# Patient Record
Sex: Male | Born: 1985 | Race: White | Hispanic: No | Smoking: Current every day smoker
Health system: Southern US, Community
[De-identification: ages and names within clinical notes are randomized; demographics above are authoritative.]

## PROBLEM LIST (undated history)

## (undated) DIAGNOSIS — R569 Unspecified convulsions: Secondary | ICD-10-CM

## (undated) DIAGNOSIS — F988 Other specified behavioral and emotional disorders with onset usually occurring in childhood and adolescence: Secondary | ICD-10-CM

## (undated) HISTORY — PX: TONSILLECTOMY: SUR1361

---

## 2014-11-22 DIAGNOSIS — F1910 Other psychoactive substance abuse, uncomplicated: Secondary | ICD-10-CM | POA: Insufficient documentation

## 2014-11-22 DIAGNOSIS — J9602 Acute respiratory failure with hypercapnia: Secondary | ICD-10-CM | POA: Insufficient documentation

## 2014-11-22 DIAGNOSIS — R569 Unspecified convulsions: Secondary | ICD-10-CM | POA: Insufficient documentation

## 2015-03-18 ENCOUNTER — Ambulatory Visit
Admission: RE | Admit: 2015-03-18 | Discharge: 2015-03-18 | Disposition: A | Discharge status: Home / Self Care | Payer: No Typology Code available for payment source | Source: Ambulatory Visit | Attending: *Deleted | Admitting: *Deleted

## 2015-03-18 ENCOUNTER — Other Ambulatory Visit: Payer: Self-pay | Admitting: *Deleted

## 2015-03-18 DIAGNOSIS — R7611 Nonspecific reaction to tuberculin skin test without active tuberculosis: Secondary | ICD-10-CM

## 2015-04-04 ENCOUNTER — Ambulatory Visit
Admission: RE | Admit: 2015-04-04 | Discharge: 2015-04-04 | Disposition: A | Discharge status: Home / Self Care | Payer: No Typology Code available for payment source | Source: Ambulatory Visit | Attending: *Deleted | Admitting: *Deleted

## 2015-04-04 ENCOUNTER — Other Ambulatory Visit: Payer: Self-pay | Admitting: *Deleted

## 2015-04-04 DIAGNOSIS — R9389 Abnormal findings on diagnostic imaging of other specified body structures: Secondary | ICD-10-CM

## 2015-04-07 ENCOUNTER — Other Ambulatory Visit: Payer: Self-pay | Admitting: General Practice

## 2015-04-07 DIAGNOSIS — R9389 Abnormal findings on diagnostic imaging of other specified body structures: Secondary | ICD-10-CM

## 2015-04-21 ENCOUNTER — Other Ambulatory Visit: Payer: No Typology Code available for payment source

## 2018-02-28 ENCOUNTER — Emergency Department (HOSPITAL_COMMUNITY)
Admission: EM | Admit: 2018-02-28 | Discharge: 2018-03-01 | Disposition: A | Discharge status: Home / Self Care | Payer: No Typology Code available for payment source | Attending: Emergency Medicine | Admitting: Emergency Medicine

## 2018-02-28 ENCOUNTER — Encounter (HOSPITAL_COMMUNITY): Payer: Self-pay

## 2018-02-28 DIAGNOSIS — F17210 Nicotine dependence, cigarettes, uncomplicated: Secondary | ICD-10-CM | POA: Insufficient documentation

## 2018-02-28 DIAGNOSIS — G5630 Lesion of radial nerve, unspecified upper limb: Secondary | ICD-10-CM

## 2018-02-28 DIAGNOSIS — G5632 Lesion of radial nerve, left upper limb: Secondary | ICD-10-CM | POA: Insufficient documentation

## 2018-02-28 HISTORY — DX: Other specified behavioral and emotional disorders with onset usually occurring in childhood and adolescence: F98.8

## 2018-02-28 LAB — CBC WITH DIFFERENTIAL/PLATELET
Band Neutrophils: 0 %
Basophils Absolute: 0 10*3/uL (ref 0.0–0.1)
Basophils Relative: 0 %
Blasts: 0 %
Eosinophils Absolute: 0 10*3/uL (ref 0.0–0.7)
Eosinophils Relative: 0 %
HEMATOCRIT: 43.6 % (ref 39.0–52.0)
HEMOGLOBIN: 14.1 g/dL (ref 13.0–17.0)
LYMPHS PCT: 7 %
Lymphs Abs: 1 10*3/uL (ref 0.7–4.0)
MCH: 31.6 pg (ref 26.0–34.0)
MCHC: 32.3 g/dL (ref 30.0–36.0)
MCV: 97.8 fL (ref 78.0–100.0)
MONO ABS: 0.6 10*3/uL (ref 0.1–1.0)
Metamyelocytes Relative: 0 %
Monocytes Relative: 4 %
Myelocytes: 0 %
NRBC: 0 /100{WBCs}
Neutro Abs: 12.7 10*3/uL — ABNORMAL HIGH (ref 1.7–7.7)
Neutrophils Relative %: 89 %
OTHER: 0 %
PROMYELOCYTES ABS: 0 %
Platelets: 289 10*3/uL (ref 150–400)
RBC: 4.46 MIL/uL (ref 4.22–5.81)
RDW: 13.4 % (ref 11.5–15.5)
WBC: 14.3 10*3/uL — ABNORMAL HIGH (ref 4.0–10.5)

## 2018-02-28 LAB — BASIC METABOLIC PANEL
Anion gap: 12 (ref 5–15)
BUN: 12 mg/dL (ref 6–20)
CO2: 28 mmol/L (ref 22–32)
Calcium: 9.2 mg/dL (ref 8.9–10.3)
Chloride: 102 mmol/L (ref 101–111)
Creatinine, Ser: 0.69 mg/dL (ref 0.61–1.24)
GFR calc Af Amer: >60 mL/min (ref >60)
GFR calc non Af Amer: >60 mL/min (ref >60)
Glucose, Bld: 86 mg/dL (ref 65–99)
POTASSIUM: 4.2 mmol/L (ref 3.5–5.1)
SODIUM: 142 mmol/L (ref 135–145)

## 2018-02-28 NOTE — ED Triage Notes (Signed)
Pt presents with numbness to L arm that he first noted upon awakening this morning.  Pt reports when he went to bed, he was fine, denies any numbness to face or leg.  Pt reports playing drums last night but denies any injury.

## 2018-02-28 NOTE — ED Notes (Signed)
Pt brought into room, pt started to vomit when getting into bed.

## 2018-02-28 NOTE — ED Provider Notes (Signed)
[facility] [place] Provider [facility]   CSN: [id number] Arrival date & time: 02/28/18  2125     History   Chief Complaint No chief complaint on file.   HPI Juan Larocheaul Majerus Jr. is a 32 y.o. male.  Patient presents to the ED with a chief complaint of left arm weakness.  He states that he awoke with the symptoms this morning.  He states that he may have slept on it wrong last night.  He has not taken anything for the symptoms.  He states that he is unable to extend his arm, wrist, and fingers.  He also complains of sensation deficit.  He denies any similar symptoms anywhere else.    The history is provided by the patient. No language interpreter was used.    Past Medical History:  Diagnosis Date  . ADD (attention deficit disorder)     There are no active problems to display for this patient.   Past Surgical History:  Procedure Laterality Date  . TONSILLECTOMY          Home Medications    Prior to Admission medications   Not on File    Family History History reviewed. No pertinent family history.  Social History Social History   Tobacco Use  . Smoking status: Current Every Day Smoker    Packs/day: 0.50  . Smokeless tobacco: Never Used  Substance Use Topics  . Alcohol use: Not on file  . Drug use: Not on file     Allergies   Patient has no known allergies.   Review of Systems Review of Systems  All other systems reviewed and are negative.    Physical Exam Updated Vital Signs BP 125/84   Pulse 82   Temp 98.1 F (36.7 C) (Oral)   Resp (!) 21   Ht 5' 10.5" (1.791 m)   Wt 61.2 kg (135 lb)   SpO2 99%   BMI 19.10 kg/m   Physical Exam  Constitutional: He is oriented to person, place, and time. No distress.  HENT:  Head: Normocephalic and atraumatic.  Eyes: Pupils are equal, round, and reactive to light. Conjunctivae and EOM are normal.  Neck: No tracheal deviation present.  Cardiovascular: Normal rate and intact  distal pulses.  Intact distal pulses  Pulmonary/Chest: Effort normal. No respiratory distress.  Abdominal: Soft.  Musculoskeletal: Normal range of motion.  Neurological: He is alert and oriented to person, place, and time.  Left wrist drop, extensor strength is 0/5, unable to make thumbs up or extend fingers, decreased sensation over first webspace  Skin: Skin is warm and dry. He is not diaphoretic.  Psychiatric: Judgment normal.  Nursing note and vitals reviewed.    ED Treatments / Results  Labs (all labs ordered are listed, but only abnormal results are displayed) Labs Reviewed  CBC WITH DIFFERENTIAL/PLATELET - Abnormal; Notable for the following components:      Result Value   WBC 14.3 (*)    Neutro Abs 12.7 (*)    All other components within normal limits  BASIC METABOLIC PANEL    EKG None  Radiology No results found.  Procedures Procedures (including critical care time)  Medications Ordered in ED Medications - No data to display   Initial Impression / Assessment and Plan / ED Course  I have reviewed the triage vital signs and the nursing notes.  Pertinent labs & imaging results that were available during my care of the patient were reviewed by me and considered in my  medical decision making (see chart for details).     Patient with left wrist drop.  Concern for radial nerve palsy.  Patient is a heavy drinker per his uncle.  Patient states that he fell asleep in a chair last night.  He is unable to extend his fingers and thumb, unable to extend at the wrist, and has decreased sensation over the first webspace.  I briefly discussed the case with Dr. Wilford Corner from neurology, who recommends outpatient follow-up in 4-6 weeks.  Patient given cockup splint.  Final Clinical Impressions(s) / ED Diagnoses   Final diagnoses:  Radial nerve palsy    ED Discharge Orders        Ordered    Ambulatory referral to Neurology    Comments:  An appointment is requested in  approximately: 4-6 weeks   03/01/18 0042       Roxy Horseman, PA-C 03/01/18 0449    Dione Booze, MD 03/01/18 [phone]

## 2018-03-01 MED ORDER — ONDANSETRON 4 MG PO TBDP
8.0000 mg | ORAL_TABLET | Freq: Once | ORAL | Status: AC
  Administered 2018-03-01: 8 mg via ORAL
  Filled 2018-03-01: qty 2

## 2018-03-01 MED ORDER — ONDANSETRON HCL 4 MG PO TABS: 8.0000 mg | ORAL_TABLET | Freq: Once | ORAL | Status: DC

## 2018-03-01 MED ORDER — ONDANSETRON HCL 4 MG/2ML IJ SOLN
4.0000 mg | Freq: Once | INTRAMUSCULAR | Status: DC
  Filled 2018-03-01: qty 2

## 2018-03-01 NOTE — Progress Notes (Signed)
Orthopedic Tech Progress Note Patient Details:  Artelia Larocheaul Trier Jr. 1986-11-20 [id number]  Ortho Devices Type of Ortho Device: Velcro wrist splint Ortho Device/Splint Location: lue Ortho Device/Splint Interventions: Ordered, Application, Adjustment   Post Interventions Patient Tolerated: Well Instructions Provided: Care of device, Adjustment of device   Trinna PostMartinez, Chellsea Beckers J 03/01/2018, 12:26 AM

## 2018-03-01 NOTE — ED Notes (Signed)
PT states understanding of care given, follow up care. PT ambulated from ED to car with a steady gait.  

## 2018-07-17 ENCOUNTER — Emergency Department (HOSPITAL_COMMUNITY)
Admission: EM | Admit: 2018-07-17 | Discharge: 2018-07-17 | Disposition: A | Discharge status: Home / Self Care | Payer: Self-pay | Attending: Emergency Medicine | Admitting: Emergency Medicine

## 2018-07-17 ENCOUNTER — Emergency Department (HOSPITAL_COMMUNITY): Payer: Self-pay

## 2018-07-17 ENCOUNTER — Encounter (HOSPITAL_COMMUNITY): Payer: Self-pay

## 2018-07-17 ENCOUNTER — Other Ambulatory Visit: Payer: Self-pay

## 2018-07-17 DIAGNOSIS — F17200 Nicotine dependence, unspecified, uncomplicated: Secondary | ICD-10-CM | POA: Insufficient documentation

## 2018-07-17 DIAGNOSIS — R569 Unspecified convulsions: Secondary | ICD-10-CM | POA: Insufficient documentation

## 2018-07-17 DIAGNOSIS — F909 Attention-deficit hyperactivity disorder, unspecified type: Secondary | ICD-10-CM | POA: Insufficient documentation

## 2018-07-17 LAB — COMPREHENSIVE METABOLIC PANEL
ALBUMIN: 4.5 g/dL (ref 3.5–5.0)
ALK PHOS: 72 U/L (ref 38–126)
ALT: 16 U/L (ref 0–44)
ANION GAP: 13 (ref 5–15)
AST: 29 U/L (ref 15–41)
BUN: 16 mg/dL (ref 6–20)
CALCIUM: 9.5 mg/dL (ref 8.9–10.3)
CHLORIDE: 101 mmol/L (ref 98–111)
CO2: 27 mmol/L (ref 22–32)
CREATININE: 0.76 mg/dL (ref 0.61–1.24)
GFR calc non Af Amer: >60 mL/min (ref >60)
GLUCOSE: 109 mg/dL — AB (ref 70–99)
Potassium: 4.1 mmol/L (ref 3.5–5.1)
Sodium: 141 mmol/L (ref 135–145)
Total Bilirubin: 0.5 mg/dL (ref 0.3–1.2)
Total Protein: 7.3 g/dL (ref 6.5–8.1)

## 2018-07-17 LAB — CBG MONITORING, ED: GLUCOSE-CAPILLARY: 97 mg/dL (ref 70–99)

## 2018-07-17 NOTE — ED Provider Notes (Signed)
[facility]-EMERGENCY DEPT Provider Note   CSN: [id number] Arrival date & time: 07/17/18  1931     History   Chief Complaint Chief Complaint  Patient presents with  . Seizures    HPI Juan Larocheaul Fiorenza Jr. is a 32 y.o. male.  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  Describes having convulsions with postictal.  Patient states that he remembers being dizzy and then waking up.  Denies any bowel or bladder dysfunction.  No tongue injury.  Denies any headache or neck pain.  No recent illicit drug use or illness.  Feels back to his baseline.     Past Medical History:  Diagnosis Date  . ADD (attention deficit disorder)     There are no active problems to display for this patient.   Past Surgical History:  Procedure Laterality Date  . TONSILLECTOMY          Home Medications    Prior to Admission medications   Not on File    Family History No family history on file.  Social History Social History   Tobacco Use  . Smoking status: Current Every Day Smoker    Packs/day: 0.50  . Smokeless tobacco: Never Used  Substance Use Topics  . Alcohol use: Not on file  . Drug use: Not on file     Allergies   Patient has no known allergies.   Review of Systems Review of Systems  All other systems reviewed and are negative.    Physical Exam Updated Vital Signs BP 130/87 (BP Location: Right Arm)   Pulse 83   Temp 98.3 F (36.8 C) (Oral)   Resp 20   Ht 1.791 m (5' 10.5")   Wt 65.8 kg   SpO2 100%   BMI 20.51 kg/m   Physical Exam  Constitutional: He is oriented to person, place, and time. He appears well-developed and well-nourished.  Non-toxic appearance. No distress.  HENT:  Head: Normocephalic and atraumatic.  Eyes: Pupils are equal, round, and reactive to light. Conjunctivae, EOM and lids are normal.  Neck: Normal range  of motion. Neck supple. No tracheal deviation present. No thyroid mass present.  Cardiovascular: Normal rate, regular rhythm and normal heart sounds. Exam reveals no gallop.  No murmur heard. Pulmonary/Chest: Effort normal and breath sounds normal. No stridor. No respiratory distress. He has no decreased breath sounds. He has no wheezes. He has no rhonchi. He has no rales.  Abdominal: Soft. Normal appearance and bowel sounds are normal. He exhibits no distension. There is no tenderness. There is no rebound and no CVA tenderness.  Musculoskeletal: Normal range of motion. He exhibits no edema or tenderness.  Neurological: He is alert and oriented to person, place, and time. He has normal strength. No cranial nerve deficit or sensory deficit. GCS eye subscore is 4. GCS verbal subscore is 5. GCS motor subscore is 6.  Skin: Skin is warm and dry. No abrasion and no rash noted.  Psychiatric: He has a normal mood and affect. His speech is normal and behavior is normal.  Nursing note and vitals reviewed.    ED Treatments / Results  Labs (all labs ordered are listed, but only abnormal results are displayed) Labs Reviewed  CBG MONITORING, ED  I-STAT CHEM 8, ED    EKG None  Radiology No results found.  Procedures Procedures (including critical care time)  Medications Ordered in ED  Medications - No data to display   Initial Impression / Assessment and Plan / ED Course  I have reviewed the triage vital signs and the nursing notes.  Pertinent labs & imaging results that were available during my care of the patient were reviewed by me and considered in my medical decision making (see chart for details).     Head CT negative.  Electrolytes within normal limits.  Unsure if patient had a seizure.  Patient will be given referral to neurology and was instructed to not drive a car or operate heavy machinery  Final Clinical Impressions(s) / ED Diagnoses   Final diagnoses:  None    ED  Discharge Orders    None       Lorre NickAllen, Kanden Carey, MD 07/17/18 2146

## 2018-07-17 NOTE — ED Triage Notes (Signed)
Pt states that he was at work and and had a funny feeling about him and he was nauseated Pt was then at home and his roommates said that he started to have convulsions Pt states he doesn't have a history of seizures but has had one years ago when taking adderall

## 2018-07-17 NOTE — Discharge Instructions (Addendum)
Do not operate heavy machinery or drive a car until you are cleared by neurology to do so

## 2018-08-01 ENCOUNTER — Encounter: Payer: Self-pay | Admitting: Neurology

## 2018-08-01 ENCOUNTER — Ambulatory Visit: Payer: Self-pay | Admitting: Neurology

## 2018-08-01 VITALS — BP 114/73 | HR 61 | Ht 69.5 in | Wt 159.0 lb

## 2018-08-01 DIAGNOSIS — R569 Unspecified convulsions: Secondary | ICD-10-CM

## 2018-08-01 NOTE — Progress Notes (Signed)
Provider:  Melvyn Harrell, M D  Referring Provider: Lorre Nick, MD Primary Care Physician:  ED  Chief Complaint  Patient presents with  . Follow-up    pt alone, rm 11 pt went to  [facility] ED on 07-17-2018 with a seizure. The pt states that prior to this he had one sz ( and was placed in coma over that) Christmas Eve 2015 with an overdose of adderall, alcohol and LSD. Marland Kitchen     HPI:  Juan Harrell. is a 32 y.o. male  seen here  In the presence of his mother on 08-01-2018 as a referral from Dr. Freida Harrell for seizures.  Juan Harrell resides at [facility], his roommates found him seizing on 17 July 2018 and he arrived at the emergency room at 1930 hours.  At the time he had neither the bowel or bladder dysfunction and incontinence no tongue bite, and no visible peripheral injuries.  His roommates described him having convulsions and being postictally confused. Juan Harrell cannot recall having had an infection, a fall or traumatic head injury just before his seizure would have involved.  His memory for the morning of the day has been preserved, he is only amnestic for about 30 minutes involving the seizures to transport to the [facility] emergency department, but there he became completely aware of his surroundings and remembers the conversation and exam.  Patient presented actually with a normal pulse rate normal blood pressure respiratory rate at rest was 20 temperature was 98.3 Fahrenheit or 36.8 Celsius, and he was described as fully oriented to person place and time.  Head CT had been obtained without contrast, and this also returned as normal.  It was interpreted by Juan Lather, MD 07/17/2018. There was no tox screen done, and given his history of drinking 4-5 beers before the seizure I doubt he has epilepsy. He has been living in a halfway house.    Social : works 12- 7 PM , second shift.  He went to college without obtaining a degree.  He drinks beer, multiple in a day- not daily-  caffeeine- 1 cup a day, no sodas, no iced tea, hot tea.  Smoker 1/2 ppd- for 10 years.   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 He has worked there again for the lat 3 month, had been there before rehab.  He is not longer on adderall for the last 3 years ( he had taken 200 mg) .    Review of Systems: Out of a complete 14 system review, the patient complains of only the following symptoms, and all other reviewed systems are negative.  No urinary incontinence, no unexplained bruises or hematomata, no tongue bite when waking up in the morning.  Social History   Socioeconomic History  . Marital status: Unknown    Spouse name: Not on file  . Number of children: Not on file  . Years of education: Not on file  . Highest education level: Not on file  Occupational History  . Not on file  Social Needs  . Financial resource strain: Not on file  . Food insecurity:    Worry: Not on file    Inability: Not on file  . Transportation needs:    Medical: Not on file    Non-medical: Not on file  Tobacco Use  . Smoking status: Current Every Day Smoker    Packs/day: 0.50  . Smokeless tobacco: Never Used  Substance and Sexual  Activity  . Alcohol use: Never    Frequency: Never  . Drug use: Never  . Sexual activity: Not on file  Lifestyle  . Physical activity:    Days per week: Not on file    Minutes per session: Not on file  . Stress: Not on file  Relationships  . Social connections:    Talks on phone: Not on file    Gets together: Not on file    Attends religious service: Not on file    Active member of club or organization: Not on file    Attends meetings of clubs or organizations: Not on file    Relationship status: Not on file  . Intimate partner violence:    Fear of current or ex partner: Not on file    Emotionally abused: Not on file    Physically abused: Not on file    Forced sexual activity: Not on file  Other Topics Concern    . Not on file  Social History Narrative  . Not on file    No family history on file.  Past Medical History:  Diagnosis Date  . ADD (attention deficit disorder)     Past Surgical History:  Procedure Laterality Date  . TONSILLECTOMY      No current outpatient medications on file.   No current facility-administered medications for this visit.     Allergies as of 08/01/2018  . (No Known Allergies)    Vitals: BP 114/73   Pulse 61   Ht 5' 9.5" (1.765 m)   Wt 159 lb (72.1 kg)   BMI 23.14 kg/m  Last Weight:  Wt Readings from Last 1 Encounters:  08/01/18 159 lb (72.1 kg)   Last Height:   Ht Readings from Last 1 Encounters:  08/01/18 5' 9.5" (1.765 m)    Physical exam:  General: The patient is awake, alert and appears not in acute distress. The patient is well groomed. Head: Normocephalic, atraumatic. Neck is supple. Mallampati, neck circumference:14.5  Cardiovascular:  Regular rate and rhythm, without murmurs or carotid bruit, and without distended neck veins. Respiratory: Lungs are clear to auscultation. Skin:  Without evidence of edema, or rash Trunk: BMI is 23  elevated and patient  has normal posture.  Neurologic exam : The patient is awake and alert, oriented to place and time.  Memory subjective described as intact. There is a normal attention span & concentration ability.  Speech is fluent without dysarthria, dysphonia or aphasia. Mood and affect are appropriate.  Cranial nerves: Pupils are equal and briskly reactive to light. Funduscopic exam without   evidence of pallor or edema.  Extraocular movements  in vertical and horizontal planes intact and without nystagmus.  Visual fields by finger perimetry are intact. Hearing to finger rub intact.  Facial sensation intact to fine touch. Facial motor strength is symmetric and tongue and uvula move midline. Tongue protrusion into either cheek is normal. Shoulder shrug is normal.   Motor exam:   Normal tone  ,muscle bulk and symmetric  strength in all extremities. Sensory:  Fine touch, pinprick and vibration were tested in all extremities. Proprioception was normal. Coordination: Rapid alternating movements in the fingers/hands were normal. Finger-to-nose maneuver  normal without evidence of ataxia, dysmetria or tremor. Gait and station: Patient walks without assistive device and is able unassisted to climb up to the exam table.  Strength within normal limits. Stance is stable and normal. Tandem gait is unfragmented. Romberg testing is negative   Deep tendon reflexes:  in the upper and lower extremities are symmetric and intact.    Assessment:  After physical and neurologic examination, review of laboratory studies, imaging, neurophysiology testing and pre-existing records, assessment is that of :   Juan Harrell, Juan Hageman. is a 32 year old Caucasian right-handed male who presents today after having suffered a seizure in late August 2019.  He had a previous seizure that could have been provoked by an overdose of Adderall, amongst other substances.  The seizure occurred at about 7 PM, and he had several beers during the day before the seizure occurred.  There were no other substances tested for documented.  He has not been on prescription Adderall in many years now.  Given his history of a second seizure with the likelihood of a seizure threshold lowering substance being present at the time I would not consider his diagnosis epilepsy.  He did not have epileptic events during his childhood or infancy, he has no family history of epilepsy.  He is at this time not able to drive or operate machinery for another 10 months for other reasons then the recent seizure.  Seizure precautions will include not operating machinery for the next 6 months, this includes a riding lawnmower car truck or even motorcycle.  I like him not to work in high altitude such as a Designer, fashion/clothing, on open fires, [employer], metal presses,  or with a saw.   Plan:  Treatment plan and additional workup : all seizure threshold lowering circumstance, substances to be avoided. STOP DRINKING.     Porfirio Mylar Aletheia Tangredi MD 08/01/2018

## 2018-08-01 NOTE — Patient Instructions (Signed)
Epilepsy Epilepsy is when a person keeps having seizures. A seizure is unusual activity in the brain. A seizure can change how you think or behave, and it can make it hard to be aware of what is happening. This condition can cause problems, such as:  Falls, accidents, and injury.  Depression.  Poor memory.  Sudden unexplained death in epilepsy (SUDEP). This is rare. Its cause is not known.  Most people with epilepsy lead normal lives. Follow these instructions at home: Medicines   Take medicines only as told by your doctor.  Avoid anything that may keep your medicine from working, such as alcohol. Activity  Get enough rest. Lack of sleep can make seizures more likely to occur.  Follow your doctor's advice about driving, swimming, and doing anything else that would be dangerous if you had a seizure. Teaching others Teach friends and family what to do if you have a seizure. They should:  Lay you on the ground to prevent a fall.  Cushion your head and body.  Loosen any tight clothing around your neck.  Turn you on your side.  Stay with you until you are better.  Not hold you down.  Not put anything in your mouth.  Know whether or not you need emergency care.  General instructions  Avoid anything that causes you to have seizures.  Keep a seizure diary. Write down what you remember about each seizure, and especially what might have caused it.  Keep all follow-up visits as told by your doctor. This is important. Contact a doctor if:  You have a change in your seizure pattern.  You get an infection or start to feel sick. You may have more seizures when you are sick. Get help right away if:  A seizure does not stop after 5 minutes.  You have more than one seizure in a row, and you do not have enough time between the seizures to feel better.  A seizure makes it harder to breathe.  A seizure is different from other seizures you have had.  A seizure makes you  unable to speak or use a part of your body.  You did not wake up right after a seizure. This information is not intended to replace advice given to you by your health care provider. Make sure you discuss any questions you have with your health care provider. Document Released: 09/12/2009 Document Revised: 06/21/2016 Document Reviewed: 05/25/2016 Elsevier Interactive Patient Education  2018 [employer]. Seizure, Adult When you have a seizure:  Parts of your body may move.  How aware or awake (conscious) you are may change.  You may shake (convulse).  Some people have symptoms right before a seizure happens. These symptoms may include:  Fear.  Worry (anxiety).  Feeling like you are going to throw up (nausea).  Feeling like the room is spinning (vertigo).  Feeling like you saw or heard something before (deja vu).  Odd tastes or smells.  Changes in vision, such as seeing flashing lights or spots.  Seizures usually last from 30 seconds to 2 minutes. Usually, they are not harmful unless they last a long time. Follow these instructions at home: Medicines  Take over-the-counter and prescription medicines only as told by your doctor.  Avoid anything that may keep your medicine from working, such as alcohol. Activity  Do not do any activities that would be dangerous if you had another seizure, like driving or swimming. Wait until your doctor approves.  If you live in the  [place] ask your local DMV (department of motor vehicles) when you can drive.  Rest. Teaching others  Teach friends and family what to do when you have a seizure. They should: ? Lay you on the ground. ? Protect your head and body. ? Loosen any tight clothing around your neck. ? Turn you on your side. ? Stay with you until you are better. ? Not hold you down. ? Not put anything in your mouth. ? Know whether or not you need emergency care. General instructions  Contact your doctor each time you have a  seizure.  Avoid anything that gives you seizures.  Keep a seizure diary. Write down: ? What you think caused each seizure. ? What you remember about each seizure.  Keep all follow-up visits as told by your doctor. This is important. Contact a doctor if:  You have another seizure.  You have seizures more often.  There is any change in what happens during your seizures.  You continue to have seizures with treatment.  You have symptoms of being sick or having an infection. Get help right away if:  You have a seizure: ? That lasts longer than 5 minutes. ? That is different than seizures you had before. ? That makes it harder to breathe. ? After you hurt your head.  After a seizure, you cannot speak or use a part of your body.  After a seizure, you are confused or have a bad headache.  You have two or more seizures in a row.  You are having seizures more often.  You do not wake up right after a seizure.  You get hurt during a seizure. In an emergency:  These symptoms may be an emergency. Do not wait to see if the symptoms will go away. Get medical help right away. Call your local emergency services (911 in the [place] Do not drive yourself to the hospital. This information is not intended to replace advice given to you by your health care provider. Make sure you discuss any questions you have with your health care provider. Document Released: 05/03/2008 Document Revised: 07/28/2016 Document Reviewed: 07/28/2016 Elsevier Interactive Patient Education  2017 [employer].

## 2018-08-03 ENCOUNTER — Other Ambulatory Visit: Payer: Self-pay

## 2018-08-04 ENCOUNTER — Encounter: Payer: Self-pay | Admitting: Neurology

## 2018-10-25 ENCOUNTER — Encounter (HOSPITAL_COMMUNITY): Payer: Self-pay

## 2018-10-25 ENCOUNTER — Emergency Department (HOSPITAL_COMMUNITY)
Admission: EM | Admit: 2018-10-25 | Discharge: 2018-10-26 | Disposition: A | Discharge status: Home / Self Care | Payer: Self-pay | Attending: Emergency Medicine | Admitting: Emergency Medicine

## 2018-10-25 ENCOUNTER — Other Ambulatory Visit: Payer: Self-pay

## 2018-10-25 DIAGNOSIS — R569 Unspecified convulsions: Secondary | ICD-10-CM | POA: Insufficient documentation

## 2018-10-25 DIAGNOSIS — F17200 Nicotine dependence, unspecified, uncomplicated: Secondary | ICD-10-CM | POA: Insufficient documentation

## 2018-10-25 HISTORY — DX: Unspecified convulsions: R56.9

## 2018-10-25 LAB — CBC WITH DIFFERENTIAL/PLATELET
ABS IMMATURE GRANULOCYTES: 0.02 10*3/uL (ref 0.00–0.07)
BASOS ABS: 0 10*3/uL (ref 0.0–0.1)
Basophils Relative: 1 %
Eosinophils Absolute: 0.4 10*3/uL (ref 0.0–0.5)
Eosinophils Relative: 8 %
HEMATOCRIT: 44.7 % (ref 39.0–52.0)
HEMOGLOBIN: 14.6 g/dL (ref 13.0–17.0)
Immature Granulocytes: 0 %
LYMPHS ABS: 1.2 10*3/uL (ref 0.7–4.0)
LYMPHS PCT: 21 %
MCH: 31.8 pg (ref 26.0–34.0)
MCHC: 32.7 g/dL (ref 30.0–36.0)
MCV: 97.4 fL (ref 80.0–100.0)
Monocytes Absolute: 0.4 10*3/uL (ref 0.1–1.0)
Monocytes Relative: 8 %
NRBC: 0 % (ref 0.0–0.2)
Neutro Abs: 3.4 10*3/uL (ref 1.7–7.7)
Neutrophils Relative %: 62 %
Platelets: 206 10*3/uL (ref 150–400)
RBC: 4.59 MIL/uL (ref 4.22–5.81)
RDW: 12.1 % (ref 11.5–15.5)
WBC: 5.5 10*3/uL (ref 4.0–10.5)

## 2018-10-25 LAB — COMPREHENSIVE METABOLIC PANEL
ALT: 19 U/L (ref 0–44)
AST: 26 U/L (ref 15–41)
Albumin: 4.1 g/dL (ref 3.5–5.0)
Alkaline Phosphatase: 51 U/L (ref 38–126)
Anion gap: 7 (ref 5–15)
BUN: 18 mg/dL (ref 6–20)
CHLORIDE: 107 mmol/L (ref 98–111)
CO2: 26 mmol/L (ref 22–32)
CREATININE: 0.78 mg/dL (ref 0.61–1.24)
Calcium: 8.8 mg/dL — ABNORMAL LOW (ref 8.9–10.3)
GFR calc non Af Amer: >60 mL/min (ref >60)
GLUCOSE: 95 mg/dL (ref 70–99)
Potassium: 4.3 mmol/L (ref 3.5–5.1)
SODIUM: 140 mmol/L (ref 135–145)
Total Bilirubin: 0.6 mg/dL (ref 0.3–1.2)
Total Protein: 6.8 g/dL (ref 6.5–8.1)

## 2018-10-25 LAB — ETHANOL

## 2018-10-25 NOTE — ED Notes (Signed)
Pt is aware a urine is needed.  

## 2018-10-25 NOTE — ED Notes (Signed)
Bed: WA07 Expected date:  Expected time:  Means of arrival:  Comments: EMS seizure 

## 2018-10-25 NOTE — ED Triage Notes (Signed)
Pt brought in by EMS from home due to grand mal seizure lasting for 3 minutes. This is his second seizure in the last 4 months according to EMS. Pt follows up with neurology but has not been placed on any medications. Per EMS, pt is confused about the seizure and coming to the hospital.

## 2018-10-25 NOTE — ED Provider Notes (Signed)
[facility]-EMERGENCY DEPT Provider Note   CSN: [id number] Arrival date & time: 10/25/18  2206    History   Chief Complaint Chief Complaint  Patient presents with  . Seizures    HPI Juan Harrell Jr. is a 32 y.o. male.   32 year old male with a history of seizure brought on by Adderall overdose presents to the emergency department following a seizure at home.  Seizure was witnessed by girlfriend while the patient was trying to sleep in his bed.  She states that he had full body shaking which lasted for approximately 3 minutes before spontaneously resolving.  Notes confusion after seizure subsided.  Patient has no recollection of the seizure.  Denies bowel or bladder incontinence.  He was seen 4 months ago in the emergency department for similar seizure-like episode.  Seen by neurology as an outpatient where it was felt that his seizures were brought on by seizure threshold lowering substances.  Girlfriend also notes increased stress recently after the patient found his roommate deceased in his room this morning.  He has no complaints of headache, body pain, nausea, vomiting, vision changes, extremity numbness or paresthesias, extremity weakness.     Past Medical History:  Diagnosis Date  . ADD (attention deficit disorder)   . Seizures [facility](HCC)     Patient Active Problem List   Diagnosis Date Noted  . Seizures (HCC) 08/01/2018    Past Surgical History:  Procedure Laterality Date  . TONSILLECTOMY          Home Medications    Prior to Admission medications   Medication Sig Start Date End Date Taking? Authorizing Provider  levETIRAcetam (KEPPRA) 500 MG tablet Take 1 tablet (500 mg total) by mouth 2 (two) times daily. 10/26/18   Antony MaduraHumes, Anna Beaird, PA-C    Family History No family history on file.  Social History Social History   Tobacco Use  . Smoking status: Current Every Day Smoker    Packs/day: 0.50  . Smokeless tobacco: Never Used  Substance  Use Topics  . Alcohol use: Yes    Frequency: Never    Comment: ocassional   . Drug use: Never     Allergies   Patient has no known allergies.   Review of Systems Review of Systems Ten systems reviewed and are negative for acute change, except as noted in the HPI.    Physical Exam Updated Vital Signs BP 121/84   Pulse (!) 58   Temp 98 F (36.7 C) (Oral)   Resp 18   Ht 5' 10.5" (1.791 m)   Wt 68 kg   SpO2 97%   BMI 21.22 kg/m   Physical Exam  Constitutional: He is oriented to person, place, and time. He appears well-developed and well-nourished. No distress.  Nontoxic appearing and in NAD  HENT:  Head: Normocephalic and atraumatic.  Mouth/Throat: Oropharynx is clear and moist.  Symmetric rise of the uvula with phonation.  Eyes: Pupils are equal, round, and reactive to light. Conjunctivae and EOM are normal. No scleral icterus.  Neck: Normal range of motion.  Cardiovascular: Normal rate, regular rhythm and intact distal pulses.  Pulmonary/Chest: Effort normal. No respiratory distress.  Respirations even and unlabored.  Musculoskeletal: Normal range of motion.  Neurological: He is alert and oriented to person, place, and time. No cranial nerve deficit. He exhibits normal muscle tone. Coordination normal.  GCS 15. Speech is goal oriented. No cranial nerve deficits appreciated; symmetric eyebrow raise, no facial drooping, tongue midline. Patient has equal grip  strength bilaterally with 5/5 strength against resistance in all major muscle groups bilaterally. Sensation to light touch intact. Patient moves extremities without ataxia.   Skin: Skin is warm and dry. No rash noted. He is not diaphoretic. No erythema. No pallor.  Psychiatric: He has a normal mood and affect. His behavior is normal.  Nursing note and vitals reviewed.    ED Treatments / Results  Labs (all labs ordered are listed, but only abnormal results are displayed) Labs Reviewed  COMPREHENSIVE METABOLIC  PANEL - Abnormal; Notable for the following components:      Result Value   Calcium 8.8 (*)    All other components within normal limits  CBC WITH DIFFERENTIAL/PLATELET  ETHANOL  RAPID URINE DRUG SCREEN, HOSP PERFORMED    EKG None  Radiology No results found.  Procedures Procedures (including critical care time)  Medications Ordered in ED Medications  levETIRAcetam (KEPPRA) IVPB 1000 mg/100 mL premix (0 mg Intravenous Stopped 10/26/18 0039)     Initial Impression / Assessment and Plan / ED Course  I have reviewed the triage vital signs and the nursing notes.  Pertinent labs & imaging results that were available during my care of the patient were reviewed by me and considered in my medical decision making (see chart for details).     32 year old male presents to the emergency department for seizure-like activity.  Was at baseline upon arrival to the emergency department.  Girlfriend at bedside reports generalized, tonic-clonic type movements lasting approximately 3 minutes.  States that the patient was confused following cessation of convulsions.  He has a history of similar seizure-like episode 4 months ago.  Is actively followed by neurology.  Has not undergone EEG.  Laboratory evaluation today is reassuring.  He has no illicit substances on board.  Alcohol is negative.  Question whether seizure may have been brought on by stress as the patient found his roommate deceased in their apartment this morning.  He has a nonfocal neurologic exam.  Recent head CT from 4 months ago reviewed.  Case discussed with Dr. Laurence Slate of neurology who recommend starting patient on 500 mg Keppra twice daily.  He was loaded with 1 g of Keppra prior to discharge.  Return precautions discussed and provided. Patient discharged in stable condition with no unaddressed concerns.   Final Clinical Impressions(s) / ED Diagnoses   Final diagnoses:  Seizure-like activity [facility])    ED Discharge Orders          Ordered    levETIRAcetam (KEPPRA) 500 MG tablet  2 times daily     10/26/18 0058           Antony Madura, PA-C 10/26/18 2956    Linwood Dibbles, MD 10/30/18 [phone]

## 2018-10-26 LAB — RAPID URINE DRUG SCREEN, HOSP PERFORMED
Amphetamines: NOT DETECTED
BARBITURATES: NOT DETECTED
Benzodiazepines: NOT DETECTED
Cocaine: NOT DETECTED
Opiates: NOT DETECTED
Tetrahydrocannabinol: NOT DETECTED

## 2018-10-26 MED ORDER — LEVETIRACETAM IN NACL 1000 MG/100ML IV SOLN
1000.0000 mg | Freq: Once | INTRAVENOUS | Status: AC
  Administered 2018-10-26: 1000 mg via INTRAVENOUS
  Filled 2018-10-26: qty 100

## 2018-10-26 MED ORDER — LEVETIRACETAM 500 MG PO TABS: 500.0000 mg | ORAL_TABLET | Freq: Two times a day (BID) | ORAL | 1 refills | Status: DC

## 2018-10-26 NOTE — Discharge Instructions (Signed)
Continue driving precautions as discussed previously by your neurologist; do not drive or operate a motor vehicle, heavy machinery, lawnmower for the next 10 months.  You have been started on Keppra for management of seizures.  You would benefit from outpatient EEG.  Follow-up with your neurologist for further evaluation of your symptoms.  You may return to the ED if symptoms persist or worsen.

## 2018-10-30 ENCOUNTER — Telehealth: Payer: Self-pay | Admitting: Neurology

## 2018-10-30 NOTE — Telephone Encounter (Signed)
Who prescribed adderall? If patient was seen with overdose of a stimulant, needs to follow up first with prescriber/ PCP - not neurology , Thank you-  CD

## 2018-11-22 ENCOUNTER — Emergency Department (HOSPITAL_COMMUNITY)
Admission: EM | Admit: 2018-11-22 | Discharge: 2018-11-22 | Disposition: A | Discharge status: Home / Self Care | Payer: Self-pay | Attending: Emergency Medicine | Admitting: Emergency Medicine

## 2018-11-22 ENCOUNTER — Emergency Department (HOSPITAL_COMMUNITY): Payer: Self-pay

## 2018-11-22 ENCOUNTER — Encounter (HOSPITAL_COMMUNITY): Payer: Self-pay

## 2018-11-22 DIAGNOSIS — R569 Unspecified convulsions: Secondary | ICD-10-CM | POA: Insufficient documentation

## 2018-11-22 DIAGNOSIS — F17200 Nicotine dependence, unspecified, uncomplicated: Secondary | ICD-10-CM | POA: Insufficient documentation

## 2018-11-22 LAB — COMPREHENSIVE METABOLIC PANEL WITH GFR
ALT: 23 U/L (ref 0–44)
AST: 35 U/L (ref 15–41)
Albumin: 4.7 g/dL (ref 3.5–5.0)
Alkaline Phosphatase: 41 U/L (ref 38–126)
Anion gap: 9 (ref 5–15)
BUN: 14 mg/dL (ref 6–20)
CO2: 26 mmol/L (ref 22–32)
Calcium: 9.3 mg/dL (ref 8.9–10.3)
Chloride: 102 mmol/L (ref 98–111)
Creatinine, Ser: 0.71 mg/dL (ref 0.61–1.24)
GFR calc Af Amer: >60 mL/min
GFR calc non Af Amer: >60 mL/min
Glucose, Bld: 103 mg/dL — ABNORMAL HIGH (ref 70–99)
Potassium: 3.9 mmol/L (ref 3.5–5.1)
Sodium: 137 mmol/L (ref 135–145)
Total Bilirubin: 1.4 mg/dL — ABNORMAL HIGH (ref 0.3–1.2)
Total Protein: 6.8 g/dL (ref 6.5–8.1)

## 2018-11-22 LAB — CBC
HCT: 42.1 % (ref 39.0–52.0)
HEMOGLOBIN: 13.9 g/dL (ref 13.0–17.0)
MCH: 32.4 pg (ref 26.0–34.0)
MCHC: 33 g/dL (ref 30.0–36.0)
MCV: 98.1 fL (ref 80.0–100.0)
PLATELETS: 195 10*3/uL (ref 150–400)
RBC: 4.29 MIL/uL (ref 4.22–5.81)
RDW: 12.9 % (ref 11.5–15.5)
WBC: 5.8 10*3/uL (ref 4.0–10.5)
nRBC: 0 % (ref 0.0–0.2)

## 2018-11-22 LAB — ETHANOL

## 2018-11-22 MED ORDER — LEVETIRACETAM 500 MG PO TABS: 500.0000 mg | ORAL_TABLET | Freq: Two times a day (BID) | ORAL | 1 refills | Status: DC

## 2018-11-22 MED ORDER — LEVETIRACETAM IN NACL 1000 MG/100ML IV SOLN
1000.0000 mg | Freq: Once | INTRAVENOUS | Status: AC
  Administered 2018-11-22: 1000 mg via INTRAVENOUS
  Filled 2018-11-22: qty 100

## 2018-11-22 NOTE — ED Provider Notes (Signed)
[facility]-EMERGENCY DEPT Provider Note   CSN: [id number] Arrival date & time: 11/22/18  1932     History   Chief Complaint Chief Complaint  Patient presents with  . Seizures    HPI Juan Larocheaul Manson Jr. is a 32 y.o. male.  HPI Patient is a 32 year old male with a history of multiple seizures before in the past.  At one point he was supposed to be on Keppra but he never filled the prescription.  He denies new medications.  No recent removal of any medications.  Denies excessive drug or alcohol intake.  He reports mild headache at this time.  No recent injury or trauma.  Denies fevers and chills.  Eating and drinking normally.  No other complaint.  [facility] but was felt not to have epilepsy and did not need to be on seizure medications.  Reported to tonic-clonic seizures with postictal.  Today.  No urinary incontinence or tongue biting.  He was in his normal state of health.  He feels an aura coming on prior to the events.   Past Medical History:  Diagnosis Date  . ADD (attention deficit disorder)   . Seizures [facility](HCC)     Patient Active Problem List   Diagnosis Date Noted  . Seizures (HCC) 08/01/2018    Past Surgical History:  Procedure Laterality Date  . TONSILLECTOMY          Home Medications    Prior to Admission medications   Medication Sig Start Date End Date Taking? Authorizing Provider  levETIRAcetam (KEPPRA) 500 MG tablet Take 1 tablet (500 mg total) by mouth 2 (two) times daily. 11/22/18   Azalia Bilisampos, Shakeena Kafer, MD    Family History No family history on file.  Social History Social History   Tobacco Use  . Smoking status: Current Every Day Smoker    Packs/day: 0.50  . Smokeless tobacco: Never Used  Substance Use Topics  . Alcohol use: Yes    Frequency: Never    Comment: ocassional   . Drug use: Never     Allergies   Patient has no known allergies.   Review of Systems Review of Systems  All  other systems reviewed and are negative.    Physical Exam Updated Vital Signs BP 124/72   Pulse 83   Temp 98.9 F (37.2 C) (Oral)   Resp 16   Ht 5\' 10"  (1.778 m)   Wt 68 kg   SpO2 96%   BMI 21.51 kg/m   Physical Exam Vitals signs and nursing note reviewed.  Constitutional:      Appearance: He is well-developed.  HENT:     Head: Normocephalic and atraumatic.  Eyes:     Pupils: Pupils are equal, round, and reactive to light.  Neck:     Musculoskeletal: Normal range of motion.  Cardiovascular:     Rate and Rhythm: Normal rate and regular rhythm.     Heart sounds: Normal heart sounds.  Pulmonary:     Effort: Pulmonary effort is normal. No respiratory distress.     Breath sounds: Normal breath sounds.  Abdominal:     General: There is no distension.     Palpations: Abdomen is soft.     Tenderness: There is no abdominal tenderness.  Musculoskeletal: Normal range of motion.  Skin:    General: Skin is warm and dry.  Neurological:     Mental Status: He is alert and oriented to person, place, and time.  Comments: 5/5 strength in major muscle groups of  bilateral upper and lower extremities. Speech normal. No facial asymetry.   Psychiatric:        Judgment: Judgment normal.      ED Treatments / Results  Labs (all labs ordered are listed, but only abnormal results are displayed) Labs Reviewed  COMPREHENSIVE METABOLIC PANEL - Abnormal; Notable for the following components:      Result Value   Glucose, Bld 103 (*)    Total Bilirubin 1.4 (*)    All other components within normal limits  CBC  ETHANOL  RAPID URINE DRUG SCREEN, HOSP PERFORMED    EKG None  Radiology Ct Head Wo Contrast  Result Date: 11/22/2018 CLINICAL DATA:  Acute onset of seizures. EXAM: CT HEAD WITHOUT CONTRAST TECHNIQUE: Contiguous axial images were obtained from the base of the skull through the vertex without intravenous contrast. COMPARISON:  CT of the head performed 07/17/2018 FINDINGS:  Brain: No evidence of acute infarction, hemorrhage, hydrocephalus, extra-axial collection or mass lesion/mass effect. The posterior fossa, including the cerebellum, brainstem and fourth ventricle, is within normal limits. The third and lateral ventricles, and basal ganglia are unremarkable in appearance. The cerebral hemispheres are symmetric in appearance, with normal gray-white differentiation. No mass effect or midline shift is seen. Vascular: No hyperdense vessel or unexpected calcification. Skull: There is no evidence of fracture; visualized osseous structures are unremarkable in appearance. Sinuses/Orbits: The visualized portions of the orbits are within normal limits. The paranasal sinuses and mastoid air cells are well-aerated. Other: No significant soft tissue abnormalities are seen. IMPRESSION: Unremarkable noncontrast CT of the head. Electronically Signed   By: Roanna RaiderJeffery  Chang M.D.   On: 11/22/2018 21:22    Procedures Procedures (including critical care time)  Medications Ordered in ED Medications  levETIRAcetam (KEPPRA) IVPB 1000 mg/100 mL premix (0 mg Intravenous Stopped 11/22/18 2050)     Initial Impression / Assessment and Plan / ED Course  I have reviewed the triage vital signs and the nursing notes.  Pertinent labs & imaging results that were available during my care of the patient were reviewed by me and considered in my medical decision making (see chart for details).     Patient is overall well-appearing.  No seizure while in the emergency department.  Outpatient neurology follow-up.  Work-up in the emergency department demonstrates normal head CT and labs that are unremarkable.  Patient family encouraged to return to the ER for new or worsening symptoms.  Standard seizure precautions.  Neurology follow-up.  Final Clinical Impressions(s) / ED Diagnoses   Final diagnoses:  Seizure [facility](HCC)    ED Discharge Orders         Ordered    levETIRAcetam (KEPPRA) 500 MG tablet  2  times daily     11/22/18 2216           Azalia Bilisampos, Grete Bosko, MD 11/22/18 2228

## 2018-11-22 NOTE — ED Notes (Signed)
Bed: WA17 Expected date:  Expected time:  Means of arrival:  Comments: 32 yo M/ seizures

## 2018-11-22 NOTE — ED Triage Notes (Signed)
Pt arrived via [facility] due to seizures. Pt noncompliant with his medication. Two witnessed grand mal seizures in the last 6 hours.

## 2018-12-03 ENCOUNTER — Emergency Department (HOSPITAL_COMMUNITY)
Admission: EM | Admit: 2018-12-03 | Discharge: 2018-12-03 | Disposition: A | Discharge status: Home / Self Care | Payer: Self-pay | Attending: Emergency Medicine | Admitting: Emergency Medicine

## 2018-12-03 ENCOUNTER — Encounter (HOSPITAL_COMMUNITY): Payer: Self-pay

## 2018-12-03 ENCOUNTER — Other Ambulatory Visit: Payer: Self-pay

## 2018-12-03 DIAGNOSIS — F17200 Nicotine dependence, unspecified, uncomplicated: Secondary | ICD-10-CM | POA: Insufficient documentation

## 2018-12-03 DIAGNOSIS — R569 Unspecified convulsions: Secondary | ICD-10-CM | POA: Insufficient documentation

## 2018-12-03 DIAGNOSIS — Z9119 Patient's noncompliance with other medical treatment and regimen: Secondary | ICD-10-CM | POA: Insufficient documentation

## 2018-12-03 LAB — CBC WITH DIFFERENTIAL/PLATELET
Abs Immature Granulocytes: 0.06 10*3/uL (ref 0.00–0.07)
Basophils Absolute: 0 10*3/uL (ref 0.0–0.1)
Basophils Relative: 0 %
Eosinophils Absolute: 0 10*3/uL (ref 0.0–0.5)
Eosinophils Relative: 0 %
HCT: 41.2 % (ref 39.0–52.0)
Hemoglobin: 13.6 g/dL (ref 13.0–17.0)
Immature Granulocytes: 1 %
Lymphocytes Relative: 6 %
Lymphs Abs: 0.7 10*3/uL (ref 0.7–4.0)
MCH: 32.5 pg (ref 26.0–34.0)
MCHC: 33 g/dL (ref 30.0–36.0)
MCV: 98.3 fL (ref 80.0–100.0)
Monocytes Absolute: 1.1 10*3/uL — ABNORMAL HIGH (ref 0.1–1.0)
Monocytes Relative: 10 %
Neutro Abs: 9.1 10*3/uL — ABNORMAL HIGH (ref 1.7–7.7)
Neutrophils Relative %: 83 %
PLATELETS: 187 10*3/uL (ref 150–400)
RBC: 4.19 MIL/uL — AB (ref 4.22–5.81)
RDW: 12.6 % (ref 11.5–15.5)
WBC: 10.9 10*3/uL — AB (ref 4.0–10.5)
nRBC: 0 % (ref 0.0–0.2)

## 2018-12-03 LAB — COMPREHENSIVE METABOLIC PANEL
ALBUMIN: 4.3 g/dL (ref 3.5–5.0)
ALT: 35 U/L (ref 0–44)
AST: 73 U/L — AB (ref 15–41)
Alkaline Phosphatase: 42 U/L (ref 38–126)
Anion gap: 13 (ref 5–15)
BUN: 16 mg/dL (ref 6–20)
CHLORIDE: 101 mmol/L (ref 98–111)
CO2: 24 mmol/L (ref 22–32)
Calcium: 8.8 mg/dL — ABNORMAL LOW (ref 8.9–10.3)
Creatinine, Ser: 0.79 mg/dL (ref 0.61–1.24)
GFR calc Af Amer: >60 mL/min (ref >60)
GFR calc non Af Amer: >60 mL/min (ref >60)
Glucose, Bld: 77 mg/dL (ref 70–99)
Potassium: 3.8 mmol/L (ref 3.5–5.1)
Sodium: 138 mmol/L (ref 135–145)
Total Bilirubin: 1.3 mg/dL — ABNORMAL HIGH (ref 0.3–1.2)
Total Protein: 6.9 g/dL (ref 6.5–8.1)

## 2018-12-03 LAB — ETHANOL: Alcohol, Ethyl (B): <10 mg/dL (ref <10)

## 2018-12-03 LAB — ACETAMINOPHEN LEVEL: Acetaminophen (Tylenol), Serum: <10 ug/mL — ABNORMAL LOW (ref 10–30)

## 2018-12-03 LAB — SALICYLATE LEVEL: Salicylate Lvl: <7 mg/dL (ref 2.8–30.0)

## 2018-12-03 LAB — CBG MONITORING, ED: Glucose-Capillary: 114 mg/dL — ABNORMAL HIGH (ref 70–99)

## 2018-12-03 MED ORDER — LEVETIRACETAM 500 MG PO TABS: 500.0000 mg | ORAL_TABLET | Freq: Two times a day (BID) | ORAL | 0 refills | Status: DC

## 2018-12-03 MED ORDER — LEVETIRACETAM 500 MG PO TABS
1500.0000 mg | ORAL_TABLET | Freq: Once | ORAL | Status: AC
  Administered 2018-12-03: 1500 mg via ORAL
  Filled 2018-12-03: qty 3

## 2018-12-03 NOTE — ED Notes (Signed)
This is a very strange milieu in terms of his "girlfriend". She is dictatorial of care to the point where I am uncomfortable and I have told her so. She is angry. The pt. Is very unkempt and disheveled and also very malodorous, almost as a homeless person would be. Our C.N., Patty gave his p.o. med. He remains in no distress.

## 2018-12-03 NOTE — ED Provider Notes (Signed)
[facility]-EMERGENCY DEPT Provider Note   CSN: [id number] Arrival date & time: 12/03/18  1004     History   Chief Complaint Chief Complaint  Patient presents with  . Seizures    HPI Juan Larocheaul Dorrough Jr. is a 33 y.o. male.  33 yo M with a chief complaint of breakthrough seizure.  The patient actually had 4 of these today.  He has not been taking his seizure medicines as this was the way it was discussed with him when he was discharged from the ED when he was here about a week ago.  Patient has not yet had an appointment as an outpatient.  He denies injury with this event today.  His fiance stated that he stares off into space and smacks his lips and then usually goes into full body shaking.  No loss of bowel or bladder no biting of the tongue.  Denies recent head injury.  The patient is somewhat sleepy on my exam.  Is unsure of who his fiance is.  Level 5 caveat altered mental status.  The history is provided by the patient.  Seizures   This is a recurrent problem. The current episode started 3 to 5 hours ago. The problem has not changed since onset.There were 4 to 5 seizures. The most recent episode lasted 30 to 120 seconds. Associated symptoms include sleepiness and confusion. Pertinent negatives include no headaches, no visual disturbance, no chest pain, no vomiting and no diarrhea. Characteristics include eye blinking, eye deviation, rhythmic jerking and loss of consciousness. Characteristics do not include bowel incontinence or bladder incontinence. The episode was witnessed. There was the sensation of an aura present. The seizures did not continue in the ED. The seizure(s) had no focality. Possible causes include missed seizure meds. There has been no fever. There were no medications administered prior to arrival.    Past Medical History:  Diagnosis Date  . ADD (attention deficit disorder)   . Seizures [facility](HCC)     Patient Active Problem List   Diagnosis Date  Noted  . Seizures (HCC) 08/01/2018    Past Surgical History:  Procedure Laterality Date  . TONSILLECTOMY          Home Medications    Prior to Admission medications   Medication Sig Start Date End Date Taking? Authorizing Provider  levETIRAcetam (KEPPRA) 500 MG tablet Take 1 tablet (500 mg total) by mouth 2 (two) times daily. 12/03/18   Melene PlanFloyd, Brice Potteiger, DO    Family History No family history on file.  Social History Social History   Tobacco Use  . Smoking status: Current Every Day Smoker    Packs/day: 0.50  . Smokeless tobacco: Never Used  Substance Use Topics  . Alcohol use: Yes    Frequency: Never    Comment: ocassional   . Drug use: Never     Allergies   Patient has no known allergies.   Review of Systems Review of Systems  Constitutional: Negative for chills and fever.  HENT: Negative for congestion and facial swelling.   Eyes: Negative for discharge and visual disturbance.  Respiratory: Negative for shortness of breath.   Cardiovascular: Negative for chest pain and palpitations.  Gastrointestinal: Negative for abdominal pain, bowel incontinence, diarrhea and vomiting.  Genitourinary: Negative for bladder incontinence.  Musculoskeletal: Negative for arthralgias and myalgias.  Skin: Negative for color change and rash.  Neurological: Positive for seizures and loss of consciousness. Negative for tremors, syncope and headaches.  Psychiatric/Behavioral: Positive for confusion. Negative for  dysphoric mood.     Physical Exam Updated Vital Signs BP 117/75   Pulse 70   Temp 99.1 F (37.3 C) (Oral)   Resp 16   Ht 5\' 10"  (1.778 m)   Wt 68 kg   SpO2 96%   BMI 21.51 kg/m   Physical Exam Vitals signs and nursing note reviewed.  Constitutional:      Appearance: He is well-developed.  HENT:     Head: Normocephalic and atraumatic.  Eyes:     Pupils: Pupils are equal, round, and reactive to light.  Neck:     Musculoskeletal: Normal range of motion and neck  supple.     Vascular: No JVD.  Cardiovascular:     Rate and Rhythm: Normal rate and regular rhythm.     Heart sounds: No murmur. No friction rub. No gallop.   Pulmonary:     Effort: No respiratory distress.     Breath sounds: No wheezing.  Abdominal:     General: There is no distension.     Tenderness: There is no guarding or rebound.  Musculoskeletal: Normal range of motion.  Skin:    Coloration: Skin is not pale.     Findings: No rash.  Neurological:     Mental Status: He is alert and oriented to person, place, and time.     Comments: Moves all 4 extremities spontaneously follows commands.  Confused to the scenario.  Psychiatric:        Behavior: Behavior normal.      ED Treatments / Results  Labs (all labs ordered are listed, but only abnormal results are displayed) Labs Reviewed  CBC WITH DIFFERENTIAL/PLATELET - Abnormal; Notable for the following components:      Result Value   WBC 10.9 (*)    RBC 4.19 (*)    Neutro Abs 9.1 (*)    Monocytes Absolute 1.1 (*)    All other components within normal limits  COMPREHENSIVE METABOLIC PANEL - Abnormal; Notable for the following components:   Calcium 8.8 (*)    AST 73 (*)    Total Bilirubin 1.3 (*)    All other components within normal limits  ACETAMINOPHEN LEVEL - Abnormal; Notable for the following components:   Acetaminophen (Tylenol), Serum <10 (*)    All other components within normal limits  CBG MONITORING, ED - Abnormal; Notable for the following components:   Glucose-Capillary 114 (*)    All other components within normal limits  ETHANOL  SALICYLATE LEVEL  RAPID URINE DRUG SCREEN, HOSP PERFORMED  URINALYSIS, ROUTINE W REFLEX MICROSCOPIC    EKG None  Radiology No results found.  Procedures Procedures (including critical care time)  Medications Ordered in ED Medications  levETIRAcetam (KEPPRA) tablet 1,500 mg (1,500 mg Oral Given 12/03/18 1222)     Initial Impression / Assessment and Plan / ED Course    I have reviewed the triage vital signs and the nursing notes.  Pertinent labs & imaging results that were available during my care of the patient were reviewed by me and considered in my medical decision making (see chart for details).     33 yo M with a chief complaint of seizure.  Patient has had 4 of them today.  His significant other felt that they are getting more and more severe and so brought him to the ED.  No continued seizures here.  Patient is awake and alert but mildly sleepy.  Family is concerned because he does not recognize them.  He was given  a Keppra load orally and reassessed with persistent confusion.  I discussed the case with Dr. Jerrell Belfast, neurology he recommended doing a laboratory evaluation and observing the patient for another hour and significant improvement.  On reassessment the patient is now able to identify his significant other.  He has improved per her.  Lab work with mild leukocytosis, no concerning electrolyte abnormality.  No Tylenol or salicylate elevation.  As the patient has had some improvement the family would like to take him home.  They are planning on seeing the neurologist tomorrow.  I will re-prescribe them Keppra.  3:14 PM:  I have discussed the diagnosis/risks/treatment options with the patient and family and believe the pt to be eligible for discharge home to follow-up with PCP, neuro. We also discussed returning to the ED immediately if new or worsening sx occur. We discussed the sx which are most concerning (e.g., sudden worsening pain, fever, inability to tolerate by mouth) that necessitate immediate return. Medications administered to the patient during their visit and any new prescriptions provided to the patient are listed below.  Medications given during this visit Medications  levETIRAcetam (KEPPRA) tablet 1,500 mg (1,500 mg Oral Given 12/03/18 1222)     The patient appears reasonably screen and/or stabilized for discharge and I doubt any other  medical condition or other [facility] requiring further screening, evaluation, or treatment in the ED at this time prior to discharge.    Final Clinical Impressions(s) / ED Diagnoses   Final diagnoses:  Seizure [facility]    ED Discharge Orders         Ordered    levETIRAcetam (KEPPRA) 500 MG tablet  2 times daily     12/03/18 1511           Melene Plan, DO 12/03/18 1514

## 2018-12-03 NOTE — Discharge Instructions (Addendum)
Take your medication as prescribed. Return for recurrent event.

## 2018-12-03 NOTE — ED Triage Notes (Signed)
Family phoned EMS d/t observing pt. To have one tonic-clonic seizure. They further told EMS that pt. Has been having seizures for a year; and that he was given a prescription for Keppra, but was "told not to take it until he sees a neurologist". He arrives here drowsy and in no distress.

## 2018-12-03 NOTE — ED Notes (Signed)
Bed: WA13 Expected date:  Expected time:  Means of arrival:  Comments: Res B 

## 2019-01-10 ENCOUNTER — Other Ambulatory Visit: Payer: Self-pay | Admitting: Neurology

## 2019-01-10 ENCOUNTER — Telehealth: Payer: Self-pay | Admitting: Neurology

## 2019-01-10 MED ORDER — LEVETIRACETAM 500 MG PO TABS: 500.0000 mg | ORAL_TABLET | Freq: Two times a day (BID) | ORAL | 0 refills | Status: DC

## 2019-01-10 NOTE — Telephone Encounter (Signed)
Refill sent for the patient. He must keep upcoming apt for future refills.

## 2019-01-10 NOTE — Telephone Encounter (Signed)
Pt is asking if he can get a refill from Dr Vickey Huger on his levETIRAcetam (KEPPRA) 500 MG tablet until his 02-25th appointment with her, if so he is asking it be called into [facility] [address] [facility]  Pt can be reached on [phone] if RN has questions

## 2019-01-23 ENCOUNTER — Encounter

## 2019-01-23 ENCOUNTER — Ambulatory Visit: Payer: Self-pay | Admitting: Neurology

## 2019-01-23 ENCOUNTER — Encounter: Payer: Self-pay | Admitting: Neurology

## 2019-01-23 VITALS — Ht 70.5 in | Wt 158.0 lb

## 2019-01-23 DIAGNOSIS — F1010 Alcohol abuse, uncomplicated: Secondary | ICD-10-CM

## 2019-01-23 DIAGNOSIS — G40909 Epilepsy, unspecified, not intractable, without status epilepticus: Secondary | ICD-10-CM

## 2019-01-23 DIAGNOSIS — F1920 Other psychoactive substance dependence, uncomplicated: Secondary | ICD-10-CM | POA: Insufficient documentation

## 2019-01-23 MED ORDER — LEVETIRACETAM 1000 MG PO TABS: 1000.0000 mg | ORAL_TABLET | Freq: Two times a day (BID) | ORAL | 11 refills | Status: DC

## 2019-01-23 NOTE — Patient Instructions (Signed)
Seizure, Adult °When you have a seizure: °· Parts of your body may move. °· You may have a change in how aware or awake (conscious) you are. °· You may shake (convulse). °Seizures usually last from 30 seconds to 2 minutes. Usually, they are not harmful unless they last a long time. °What are the signs or symptoms? °Common symptoms of this condition include: °· Shaking (convulsions). °· Stiffness in the body. °· Passing out (losing consciousness). °· Uncontrolled movements in the: °? Arms or legs. °? Eyes. °? Head. °? Mouth. °Some people have symptoms right before a seizure happens. These symptoms may include: °· Fear. °· Worry (anxiety). °· Feeling like you are going to throw up (nausea). °· Feeling like the room is spinning (vertigo). °· Feeling like you saw or heard something before (déjà vu). °· Odd tastes or smells. °· Changes in vision, such as seeing flashing lights or spots. °Follow these instructions at home: °Medicines ° °· Take over-the-counter and prescription medicines only as told by your doctor. °· Do not eat or drink anything that may keep your medicine from working, such as alcohol. °Activity °· Do not do any activities that would be dangerous if you had another seizure, like driving or swimming. Wait until your doctor says it is safe for you to do them. °· If you live in the [place] ask your local DMV (department of motor vehicles) when you can drive. °· Get plenty of rest. °Teaching others ° °· Teach friends and family what to do when you have a seizure. They should: °? Lay you on the ground. °? Protect your head and body. °? Loosen any tight clothing around your neck. °? Turn you on your side. °? Not hold you down. °? Not put anything into your mouth. °? Know whether or not you need emergency care. °? Stay with you until you are better. °General instructions °· Contact your doctor each time you have a seizure. °· Avoid anything that gives you seizures. °· Keep a seizure diary. Write down: °? What  you think caused each seizure. °? What you remember about each seizure. °· Keep all follow-up visits as told by your doctor. This is important. °Contact a doctor if: °· You have another seizure. °· You have seizures more often. °· There is any change in what happens during your seizures. °· You keep having seizures with treatment. °· You have symptoms of being sick or having an infection. °Get help right away if: °· You have a seizure: °? That lasts longer than 5 minutes. °? That is different than seizures you had before. °? That makes it harder to breathe. °? After you hurt your head. °· After a seizure, you cannot speak or use a part of your body. °· After a seizure, you are confused or have a bad headache. °· You have two or more seizures in a row. °· You are having seizures more often. °· You do not wake up right after a seizure. °· You get hurt during a seizure. °In an emergency: °· These symptoms may be an emergency. Do not wait to see if the symptoms will go away. Get medical help right away. Call your local emergency services (911 in the [place] Do not drive yourself to the hospital. °Summary °· Seizures usually last from 30 seconds to 2 minutes. Usually, they are not harmful unless they last a long time. °· Do not eat or drink anything that may keep your medicine from working, such as alcohol. °·   Teach friends and family what to do when you have a seizure. °· Contact your doctor each time you have a seizure. °This information is not intended to replace advice given to you by your health care provider. Make sure you discuss any questions you have with your health care provider. °Document Released: 05/03/2008 Document Revised: 08/09/2018 Document Reviewed: 12/22/2017 °Elsevier Interactive Patient Education © 2019 [employer] Inc[employer] ° °

## 2019-01-23 NOTE — Progress Notes (Signed)
Provider:  Melvyn Novas, M D  Referring Provider: Azalia Bilis, MD  Primary Care Physician:  ED  Chief Complaint  Patient presents with  . Follow-up    pt alone, rm 11 pt went to  [facility] ED on 07-17-2018 with a seizure. The pt states that prior to this he had one sz ( and was placed in coma over that) Christmas Eve 2015 with an overdose of adderall, alcohol and LSD.        Interval history : 01-23-2019:  Patient here with parents on video connection, and Juan Harrell, his girl friend.  Juan Harrell is a 7 year old Caucasian right-handed individual whom I have encountered once before for seizure related activity.  He was asked to return for an EEG but no -showed, and he had no follow up .   Since our last visit he has had several more seizures the first followed on Thanksgiving day 2019.  He suffered 2 seizures separated by a period of 4 to 6 hours. Individual seizures lasted 3 minutes.  He was seen in the emergency room.  The I do not have the documentation of that visit available here but the next visit from Christmas day 11-22-18 at 7:30 PM he presents with multiple seizures he had not taken his Keppra, had mild headaches at the time had tonic-clonic seizures -convulsions but with witnessed, no urinary incontinence or tongue biting.  He was previously in his normal state of health he was not febrile did not struggle with any infection.  Dr. Patria Mane in the emergency room prescribed Keppra 500 mg twice daily vital signs were remarkably normal blood pressure 124/72 heart rate 83 respiratory rate 16.   Drug screen was not obtained.  Total bilirubin was elevated at 1.4, glucose 103.  The last seizure occurred on 03 December 2018 at 10 AM, they had already started the night before and the patient had witnessed for seizures had not been taking the Keppra, presented with a very altered mental status.  The seizures did not continue into the ED visit.  Again no fever, also the patient appeared confused  he was able to move all 4 extremities and follows simple commands.  Total bilirubin again elevated at 1.3, capillary glucose normal levels.  He was now ordered 1500 mg Keppra orally and loaded food".  He was asked to fill his prescription.  He was discharged in the night of 03 December 2020 home care.  He started to take Keppra and had no further seizures. He is able to describe a sense of doom, panic, and unpleasant taste in his mouth. 2 minutes prior to seizing. He reports sweating more on Keppra .    HPI:  Juan Min. is a 33 y.o. male and was seen here in the presence of his mother on 08-01-2018 as a referral from Dr. ED  for seizures. [name] resides at [facility], his roommates found him seizing on 17 July 2018 and he arrived at the emergency room at 1930 hours.  At the time he had neither the bowel or bladder dysfunction and incontinence no tongue bite, and no visible peripheral injuries.  His roommates described him having convulsions and being postictally confused. Juan Harrell cannot recall having had an infection, a fall or traumatic head injury just before his seizure would have involved.  His memory for the morning of the day has been preserved, he is only amnestic for about 30 minutes involving the seizures to transport to the [facility]  emergency department, but there he became completely aware of his surroundings and remembers the conversation and exam.  Patient presented actually with a normal pulse rate normal blood pressure respiratory rate at rest was 20 temperature was 98.3 Fahrenheit or 36.8 Celsius, and he was described as fully oriented to person place and time.  Head CT had been obtained without contrast, and this also returned as normal.  It was interpreted by [name], MD 07/17/2018. There was no tox screen done, and given his history of drinking 4-5 beers before the seizure I doubt he has epilepsy. He has been living in a halfway house.    Social : works 12- 7  PM , second shift.  He went to college without obtaining a degree.  He drinks beer, multiple in a day- not daily- caffeeine- 1 cup a day, no sodas, no iced tea, hot tea.  Smoker 1/2 ppd- for 10 years.  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 He has worked there again for the lat 3 month, had been there before rehab. He is not longer on adderall for the last 3 years ( he had taken 200 mg) .    Review of Systems: Out of a complete 14 system review, the patient complains of only the following symptoms, and all other reviewed systems are negative.  No urinary incontinence, no unexplained bruises or hematomata, no tongue bite when waking up in the morning. still unchanged but multiple seizures reported.   Social History   Socioeconomic History  . Marital status: Unknown    Spouse name: Not on file  . Number of children: Not on file  . Years of education: Not on file  . Highest education level: Not on file  Occupational History  . Not on file  Social Needs  . Financial resource strain: Not on file  . Food insecurity:    Worry: Not on file    Inability: Not on file  . Transportation needs:    Medical: Not on file    Non-medical: Not on file  Tobacco Use  . Smoking status: Current Every Day Smoker    Packs/day: 0.50  . Smokeless tobacco: Never Used  Substance and Sexual Activity  . Alcohol use: Yes    Frequency: Never    Comment: ocassional   . Drug use: Never  . Sexual activity: Not on file  Lifestyle  . Physical activity:    Days per week: Not on file    Minutes per session: Not on file  . Stress: Not on file  Relationships  . Social connections:    Talks on phone: Not on file    Gets together: Not on file    Attends religious service: Not on file    Active member of club or organization: Not on file    Attends meetings of clubs or organizations: Not on file    Relationship status: Not on file  .  Intimate partner violence:    Fear of current or ex partner: Not on file    Emotionally abused: Not on file    Physically abused: Not on file    Forced sexual activity: Not on file  Other Topics Concern  . Not on file  Social History Narrative  . Not on file    No family history on file.  Past Medical History:  Diagnosis Date  . ADD (attention deficit disorder)   . Seizures (HCC)  Past Surgical History:  Procedure Laterality Date  . TONSILLECTOMY      Current Outpatient Medications  Medication Sig Dispense Refill  . levETIRAcetam (KEPPRA) 500 MG tablet Take 1 tablet (500 mg total) by mouth 2 (two) times daily. 60 tablet 0   No current facility-administered medications for this visit.     Allergies as of 01/23/2019  . (No Known Allergies)    Vitals: Ht 5' 10.5" (1.791 m)   Wt 158 lb (71.7 kg)   BMI 22.35 kg/m  Last Weight:  Wt Readings from Last 1 Encounters:  01/23/19 158 lb (71.7 kg)   Last Height:   Ht Readings from Last 1 Encounters:  01/23/19 5' 10.5" (1.791 m)    Physical exam:  General: The patient is awake, alert and appears not in acute distress. The patient is poorly groomed. Head: Normocephalic, atraumatic. Neck is supple. Mallampati, neck circumference:14.5  Cardiovascular:  Regular rate and rhythm, without murmurs or carotid bruit, and without distended neck veins. Respiratory: Lungs are clear to auscultation. Skin:  Without evidence of edema, or rash Trunk: BMI is 23  elevated and patient  has normal posture.  Neurologic exam : The patient is awake and alert, oriented to place and time.  Memory subjective described as intact. There is a normal attention span & concentration ability.  Speech is fluent without dysarthria, dysphonia or aphasia. Mood and affect are calm and cooperative. .  Cranial nerves: Pupils are equal and briskly reactive to light. Funduscopic exam without evidence of pallor or edema. Extraocular movements  in vertical  and horizontal planes intact and with end point nystagmus.  Visual fields by finger perimetry are intact. Hearing to finger rub intact.  Facial sensation intact to fine touch. Facial motor strength is symmetric and tongue and uvula move midline. Tongue protrusion into either cheek is normal. Shoulder shrug is normal.  Eyelid flutter noted.   Motor exam:   Normal tone ,muscle bulk and symmetric strength in all extremities. Sensory:  Fine touch, pinprick and vibration were tested in all extremities. Proprioception was normal. Coordination: there os tremor at rest and with action. Eyelid flutter and finger tremor.  Rapid alternating movements in the fingers/hands were also impaired by tremor.  Finger-to-nose maneuver with  tremor. Gait and station: Patient walks without assistive device .Strength within normal limits. Stance is stable and normal.  Tandem gait is unfragmented. He is jittery- but not unsteady. Romberg testing is negative   Deep tendon reflexes: in the upper and lower extremities are symmetric and intact.    Assessment:  After physical and neurologic examination, review of laboratory studies, imaging, neurophysiology testing and pre-existing records, assessment is that of :  Juan Harrell, Juan Hageman. is a 33 year old Caucasian right-handed male who presents today after having suffered a first seizure in late August 2019. He has now had additional seizures in November 2019 ( possibly with alcohol) , Christmas 2019  ( 2 beers)  and 12-03-2018 ( no alcohol involved). He is still drinking.   Continue Keppra at the current dose. Get sleep, eat a moderate diet and eat regularly.     EEG ordered, MRI will not be possible( self pay)  .   AVOID all seizure threshold lowering circumstance, substances to be avoided. STOP DRINKING.     Porfirio Mylar Gayleen Sholtz MD 01/23/2019                Provider:  Melvyn Novas, M D  Referring Provider: No ref. provider found Primary Care  Physician:  ED  Chief Complaint  Patient presents with  . Follow-up    pt alone, rm 11 pt went to  [facility] ED on 07-17-2018 with a seizure. The pt states that prior to this he had one sz ( and was placed in coma over that) Christmas Eve 2015 with an overdose of adderall, alcohol and LSD. Marland Kitchen     HPI:  Juan Natter. is a 33 y.o. male  seen here  In the presence of his mother on 08-01-2018 as a referral from Dr. No ref. provider found for seizures.  Juan Harrell resides at [facility], his roommates found him seizing on 17 July 2018 and he arrived at the emergency room at 1930 hours.  At the time he had neither the bowel or bladder dysfunction and incontinence no tongue bite, and no visible peripheral injuries.  His roommates described him having convulsions and being postictally confused. Mr. Tillett cannot recall having had an infection, a fall or traumatic head injury just before his seizure would have involved.  His memory for the morning of the day has been preserved, he is only amnestic for about 30 minutes involving the seizures to transport to the [facility] emergency department, but there he became completely aware of his surroundings and remembers the conversation and exam.  Patient presented actually with a normal pulse rate normal blood pressure respiratory rate at rest was 20 temperature was 98.3 Fahrenheit or 36.8 Celsius, and he was described as fully oriented to person place and time.  Head CT had been obtained without contrast, and this also returned as normal.  It was interpreted by [name], MD 07/17/2018. There was no tox screen done, and given his history of drinking 4-5 beers before the seizure I doubt he has epilepsy. He has been living in a halfway house.    Social : works 12- 7 PM , second shift.  He went to college without obtaining a degree.  He drinks beer, multiple in a day- not daily- caffeeine- 1 cup a day, no sodas, no iced tea, hot tea.  Smoker 1/2 ppd- for 10  years.   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 He has worked there again for the lat 3 month, had been there before rehab.  He is not longer on adderall for the last 3 years ( he had taken 200 mg) .    Review of Systems: Out of a complete 14 system review, the patient complains of only the following symptoms, and all other reviewed systems are negative.  No urinary incontinence, no unexplained bruises or hematomata, no tongue bite when waking up in the morning.  Social History   Socioeconomic History  . Marital status: Unknown    Spouse name: Not on file  . Number of children: Not on file  . Years of education: Not on file  . Highest education level: Not on file  Occupational History  . Not on file  Social Needs  . Financial resource strain: Not on file  . Food insecurity:    Worry: Not on file    Inability: Not on file  . Transportation needs:    Medical: Not on file    Non-medical: Not on file  Tobacco Use  . Smoking status: Current Every Day Smoker    Packs/day: 0.50  . Smokeless tobacco: Never Used  Substance and Sexual Activity  . Alcohol use: Yes    Frequency: Never    Comment:  ocassional   . Drug use: Never  . Sexual activity: Not on file  Lifestyle  . Physical activity:    Days per week: Not on file    Minutes per session: Not on file  . Stress: Not on file  Relationships  . Social connections:    Talks on phone: Not on file    Gets together: Not on file    Attends religious service: Not on file    Active member of club or organization: Not on file    Attends meetings of clubs or organizations: Not on file    Relationship status: Not on file  . Intimate partner violence:    Fear of current or ex partner: Not on file    Emotionally abused: Not on file    Physically abused: Not on file    Forced sexual activity: Not on file  Other Topics Concern  . Not on file  Social History Narrative  . Not on file     No family history on file.  Past Medical History:  Diagnosis Date  . ADD (attention deficit disorder)   . Seizures (HCC)     Past Surgical History:  Procedure Laterality Date  . TONSILLECTOMY      Current Outpatient Medications  Medication Sig Dispense Refill  . levETIRAcetam (KEPPRA) 500 MG tablet Take 1 tablet (500 mg total) by mouth 2 (two) times daily. 60 tablet 0   No current facility-administered medications for this visit.     Allergies as of 01/23/2019  . (No Known Allergies)    Vitals: Ht 5' 10.5" (1.791 m)   Wt 158 lb (71.7 kg)   BMI 22.35 kg/m  Last Weight:  Wt Readings from Last 1 Encounters:  01/23/19 158 lb (71.7 kg)   Last Height:   Ht Readings from Last 1 Encounters:  01/23/19 5' 10.5" (1.791 m)    Physical exam:  General: The patient is awake, alert and appears not in acute distress. The patient is well groomed. Head: Normocephalic, atraumatic. Neck is supple. Mallampati, neck circumference:14.5  Cardiovascular:  Regular rate and rhythm, without murmurs or carotid bruit, and without distended neck veins. Respiratory: Lungs are clear to auscultation. Skin:  Without evidence of edema, or rash Trunk: BMI is 23  elevated and patient  has normal posture.  Neurologic exam : The patient is awake and alert, oriented to place and time.  Memory subjective described as intact. There is a normal attention span & concentration ability.  Speech is fluent without dysarthria, dysphonia or aphasia. Mood and affect are appropriate.  Cranial nerves: Pupils are equal and briskly reactive to light. Funduscopic exam without   evidence of pallor or edema.  Extraocular movements  in vertical and horizontal planes intact and without nystagmus.  Visual fields by finger perimetry are intact. Hearing to finger rub intact.  Facial sensation intact to fine touch. Facial motor strength is symmetric and tongue and uvula move midline. Tongue protrusion into either cheek  is normal. Shoulder shrug is normal.   Motor exam:   Normal tone ,muscle bulk and symmetric  strength in all extremities. Sensory:  Fine touch, pinprick and vibration were tested in all extremities. Proprioception was normal. Coordination: Rapid alternating movements in the fingers/hands were normal. Finger-to-nose maneuver  normal without evidence of ataxia, dysmetria or tremor. Gait and station: Patient walks without assistive device and is able unassisted to climb up to the exam table.  Strength within normal limits. Stance is stable and normal. Tandem gait  is unfragmented. Romberg testing is negative   Deep tendon reflexes: in the upper and lower extremities are symmetric and intact.    Assessment:  After physical and neurologic examination, review of laboratory studies, imaging, neurophysiology testing and pre-existing records, assessment is that of :   Mr. Diarra Kos, Juan Hageman. is a 33 year old Caucasian right-handed male who presents today after having suffered a seizure in late August 2019.  He had a previous seizure that could have been provoked by an overdose of Adderall, amongst other substances.  The seizure occurred at about 7 PM, and he had several beers during the day before the seizure occurred.  There were no other substances tested for documented.  He has not been on prescription Adderall in many years now.  Given his history of a second seizure with the likelihood of a seizure threshold lowering substance being present at the time I would not consider his diagnosis epilepsy.  He did not have epileptic events during his childhood or infancy, he has no family history of epilepsy.  He is at this time not able to drive or operate machinery for another 10 months for other reasons then the recent seizure.  Seizure precautions will include not operating machinery for the next 6 months, this includes a riding lawnmower car truck or even motorcycle.  I like him not to work in high altitude  such as a Designer, fashion/clothing, on open fires, [employer], metal presses, or with a saw.   Plan:  Treatment plan and additional workup : all seizure threshold lowering circumstance, substances to be avoided. STOP DRINKING.     Porfirio Mylar Michaele Amundson MD 01/23/2019

## 2019-02-08 ENCOUNTER — Telehealth: Payer: Self-pay | Admitting: Neurology

## 2019-02-08 MED ORDER — LEVETIRACETAM 1000 MG PO TABS: 1000.0000 mg | ORAL_TABLET | Freq: Two times a day (BID) | ORAL | 3 refills | Status: DC

## 2019-02-08 NOTE — Telephone Encounter (Signed)
Patient called in and stated that levETIRAcetam (KEPPRA) 1000 MG tablet twice a day cost too much can it be switch to levETIRAcetam (KEPPRA) 500 MG 4 times a day instead so that he can afford it

## 2019-02-08 NOTE — Telephone Encounter (Signed)
Spoke with both Juan Harrell and his mother and reviewed GoodRx prices for Levetiracetam 1000mg .  Per GoodRx.com, [facility] cost for a 30 day rx. is just over $20, and cost for a 90 day rx. is just over $45. I texted the GoodRx  free coupon to pt's mother, and sent a 90 day rx. to [facility] per her request. I also advised that she call [facility] to confirm that price before picking rx. up.  She verbalized understanding of same/fim

## 2019-02-28 ENCOUNTER — Other Ambulatory Visit: Payer: Self-pay

## 2019-08-08 ENCOUNTER — Ambulatory Visit: Payer: Self-pay | Admitting: Adult Health

## 2019-08-09 ENCOUNTER — Encounter: Payer: Self-pay | Admitting: Adult Health

## 2019-11-14 ENCOUNTER — Telehealth: Payer: Self-pay | Admitting: *Deleted

## 2019-11-14 NOTE — Telephone Encounter (Signed)
Pt called need refill on Keppra. Please call  [phone]

## 2019-11-14 NOTE — Telephone Encounter (Signed)
Also if the patient calls back he will be due for a yearly follow up in Feb. If things are stable he can also get this medication provided by PCP but I don't see where PCP is on file.

## 2019-11-14 NOTE — Telephone Encounter (Signed)
Called the patient, there was no answer. LVM informing in regards to [place], on our end it shows that in march a 3 mth supply with 3 refills was sent to [facility]. The patient should have plenty to get him through til year apt. LVM informing to follow up with [facility] and if a new script is needed for some reason the pharmacy should reach out to [place] for a RX request.

## 2019-12-10 ENCOUNTER — Ambulatory Visit: Payer: Self-pay | Admitting: Neurology

## 2019-12-10 ENCOUNTER — Other Ambulatory Visit: Payer: Self-pay

## 2019-12-10 DIAGNOSIS — G40909 Epilepsy, unspecified, not intractable, without status epilepticus: Secondary | ICD-10-CM

## 2019-12-10 DIAGNOSIS — F1010 Alcohol abuse, uncomplicated: Secondary | ICD-10-CM

## 2019-12-10 DIAGNOSIS — F1920 Other psychoactive substance dependence, uncomplicated: Secondary | ICD-10-CM

## 2019-12-31 ENCOUNTER — Telehealth: Payer: Self-pay | Admitting: Neurology

## 2019-12-31 NOTE — Procedures (Signed)
Mr. Juan Harrell, Juan Harrell. underwent a 35 minutes 17 seconds long EEG recording.  This EEG was ordered in an encounter on 01-23-2019.  The study was reviewed in the International 10-20 arrangement of electrodes with 1 channel dedicated to EKG recording.  The patient felt throughout the recording of regular EKG in sinus rhythm between 64 and 72 bpm.  The patient was alert and oriented at the beginning of the study and 9 Hz posterior dominant rhythm was established by the patient's eyes were closed.  This rhythm promptly attenuated with eye opening.  Throughout the recording there was a lot of eye blinking artifact noted and during hyperventilation maneuver the amplitude of all channels bilaterally increased but the eye blinking never stopped.  Post hyperventilation there was minimal slowing noted no epileptiform discharges were provoked.  Then photic entrainment followed, this provocation maneuver showed the highest entrainment at 9 Hz, unsurprisingly as it is close to the natural posterior dominant rhythm.  Photic stimulation entrainment was seen up to 21 Hz/min and eye blink artifact was still noted between and during photic stimulation.  The photic stimulation maneuver was followed by a period of rest during which the patient became drowsy, bitemporal EEG slowing was noted, and he finally entered non-REM sleep stage I.  This was a normal EEG in relation to the absence of epileptiform discharge.  The excessive facial blinking may be related to a dry eye discomfort or facial tic.  However it was not related to epileptiform activity.  Porfirio Mylar Montee Tallman

## 2019-12-31 NOTE — Progress Notes (Signed)
Normal EEG , excessive eye blinking was noted. CD No follow up needed.

## 2019-12-31 NOTE — Telephone Encounter (Signed)
Called the patient back. There was no answer. Left a detailed message informing the patient that he can be transitioned to a video visit if he would like but would have to sign up through our mychart and complete through mychart VV.  Advised the medication question would need to be deferred and discussed at the time of the apt. Typically this would need to be addressed and ordered by a PCP but he can ask at the apt.   **if patient calls back. If he is not opposed to changing apt time please offer patient mychart VV with NP if there are still openings. If not may convert his current apt to mychart VV.

## 2019-12-31 NOTE — Telephone Encounter (Signed)
Patient called in wanting to know if his appt can be virtual or by telephone and also wants to know if he can possibly start on a medication called Naltrexone

## 2020-01-03 ENCOUNTER — Telehealth: Payer: Self-pay | Admitting: Neurology

## 2020-01-03 NOTE — Telephone Encounter (Signed)
-----   Message from Melvyn Novas, MD sent at 12/31/2019  5:13 PM EST ----- Normal EEG , excessive eye blinking was noted. CD No follow up needed.

## 2020-01-03 NOTE — Telephone Encounter (Signed)
Called the patient and advised that EEG was reviewed by Dr Vickey Huger and was normal. Pt has a follow up that is scheduled upcoming with NP.

## 2020-01-17 ENCOUNTER — Telehealth: Payer: Self-pay | Admitting: Neurology

## 2020-01-17 ENCOUNTER — Encounter: Payer: Self-pay | Admitting: Neurology

## 2020-01-17 NOTE — Telephone Encounter (Signed)
Called the patient to advise our office will not be open for the afternoon due to inclement weather. There was no answer. LVM advising the patient to call the office back and reschedule. In VM I did advise the patient there was uncertainty of whether our office phones would be open on Friday. Advised the patient could call back on Monday if unable to get [place] on Friday.  *If patient calls back please reschedule with either Dr Vickey Huger or NP. If needed can use Dr Dohmeier sleep consults slot.

## 2020-01-21 ENCOUNTER — Encounter: Payer: Self-pay | Admitting: Neurology

## 2020-01-22 ENCOUNTER — Encounter: Payer: Self-pay | Admitting: Neurology

## 2020-01-22 ENCOUNTER — Telehealth (INDEPENDENT_AMBULATORY_CARE_PROVIDER_SITE_OTHER): Payer: Self-pay | Admitting: Neurology

## 2020-01-22 DIAGNOSIS — F1920 Other psychoactive substance dependence, uncomplicated: Secondary | ICD-10-CM

## 2020-01-22 DIAGNOSIS — R569 Unspecified convulsions: Secondary | ICD-10-CM

## 2020-01-22 DIAGNOSIS — F19921 Other psychoactive substance use, unspecified with intoxication with delirium: Secondary | ICD-10-CM

## 2020-01-22 HISTORY — DX: Other psychoactive substance use, unspecified with intoxication with delirium: F19.921

## 2020-01-22 MED ORDER — LEVETIRACETAM 1000 MG PO TABS: 1000.0000 mg | ORAL_TABLET | Freq: Two times a day (BID) | ORAL | 1 refills | Status: DC

## 2020-01-22 NOTE — Patient Instructions (Signed)
Levetiracetam tablets What is this medicine? LEVETIRACETAM (lee ve tye RA se tam) is an antiepileptic drug. It is used with other medicines to treat certain types of seizures. This medicine may be used for other purposes; ask your health care provider or pharmacist if you have questions. COMMON BRAND NAME(S): Keppra, Roweepra What should I tell my health care provider before I take this medicine? They need to know if you have any of these conditions:  kidney disease  suicidal thoughts, plans, or attempt; a previous suicide attempt by you or a family member  an unusual or allergic reaction to levetiracetam, other medicines, foods, dyes, or preservatives  pregnant or trying to get pregnant  breast-feeding How should I use this medicine? Take this medicine by mouth with a glass of water. Follow the directions on the prescription label. Swallow the tablets whole. Do not crush or chew this medicine. You may take this medicine with or without food. Take your doses at regular intervals. Do not take your medicine more often than directed. Do not stop taking this medicine or any of your seizure medicines unless instructed by your doctor or health care professional. Stopping your medicine suddenly can increase your seizures or their severity. A special MedGuide will be given to you by the pharmacist with each prescription and refill. Be sure to read this information carefully each time. Contact your pediatrician or health care professional regarding the use of this medication in children. While this drug may be prescribed for children as young as 4 years of age for selected conditions, precautions do apply. Overdosage: If you think you have taken too much of this medicine contact a poison control center or emergency room at once. NOTE: This medicine is only for you. Do not share this medicine with others. What if I miss a dose? If you miss a dose, take it as soon as you can. If it is almost time for  your next dose, take only that dose. Do not take double or extra doses. What may interact with this medicine? This medicine may interact with the following medications:  carbamazepine  colesevelam  probenecid  sevelamer This list may not describe all possible interactions. Give your health care provider a list of all the medicines, herbs, non-prescription drugs, or dietary supplements you use. Also tell them if you smoke, drink alcohol, or use illegal drugs. Some items may interact with your medicine. What should I watch for while using this medicine? Visit your doctor or health care provider for a regular check on your progress. Wear a medical identification bracelet or chain to say you have epilepsy, and carry a card that lists all your medications. This medicine may cause serious skin reactions. They can happen weeks to months after starting the medicine. Contact your health care provider right away if you notice fevers or flu-like symptoms with a rash. The rash may be red or purple and then turn into blisters or peeling of the skin. Or, you might notice a red rash with swelling of the face, lips or lymph nodes in your neck or under your arms. It is important to take this medicine exactly as instructed by your health care provider. When first starting treatment, your dose may need to be adjusted. It may take weeks or months before your dose is stable. You should contact your doctor or health care provider if your seizures get worse or if you have any new types of seizures. You may get drowsy or dizzy. Do not drive,   use machinery, or do anything that needs mental alertness until you know how this medicine affects you. Do not stand or sit up quickly, especially if you are an older patient. This reduces the risk of dizzy or fainting spells. Alcohol may interfere with the effect of this medicine. Avoid alcoholic drinks. The use of this medicine may increase the chance of suicidal thoughts or actions.  Pay special attention to how you are responding while on this medicine. Any worsening of mood, or thoughts of suicide or dying should be reported to your health care provider right away. Women who become pregnant while using this medicine may enroll in the North American Antiepileptic Drug Pregnancy Registry by calling 1-888-233-2334. This registry collects information about the safety of antiepileptic drug use during pregnancy. What side effects may I notice from receiving this medicine? Side effects that you should report to your doctor or health care professional as soon as possible:  allergic reactions like skin rash, itching or hives, swelling of the face, lips, or tongue  breathing problems  dark urine  general ill feeling or flu-like symptoms  problems with balance, talking, walking  rash, fever, and swollen lymph nodes  redness, blistering, peeling or loosening of the skin, including inside the mouth  unusually weak or tired  worsening of mood, thoughts or actions of suicide or dying  yellowing of the eyes or skin Side effects that usually do not require medical attention (report to your doctor or health care professional if they continue or are bothersome):  diarrhea  dizzy, drowsy  headache  loss of appetite This list may not describe all possible side effects. Call your doctor for medical advice about side effects. You may report side effects to FDA at 1-800-FDA-1088. Where should I keep my medicine? Keep out of reach of children. Store at room temperature between 15 and 30 degrees C (59 and 86 degrees F). Throw away any unused medicine after the expiration date. NOTE: This sheet is a summary. It may not cover all possible information. If you have questions about this medicine, talk to your doctor, pharmacist, or health care provider.  2020 Elsevier/Gold Standard (2019-02-16 15:23:36)  

## 2020-01-22 NOTE — Progress Notes (Signed)
Provider:  Melvyn Novas, M D  Referring Provider: No ref. provider found Primary Care Physician:  ED  Chief Complaint  Patient presents with  . Follow-up    pt alone, rm 12 pt went to  [facility] ED on 07-17-2018 with a seizure. The pt states that prior to this he had one sz ( and was placed in coma over that) Christmas Eve 2015 with an overdose of adderall, alcohol and LSD. Marland Kitchen     HPI:  Juan Harrell. is a 34 y.o. male  seen here  In the presence of his mother on 08-01-2018 as a referral from Dr. No ref. provider found for seizures.   Mrs. Karman resides at [facility], his roommates found him seizing on 17 July 2018 and he arrived at the emergency room at 1930 hours.  At the time he had neither the bowel or bladder dysfunction and incontinence no tongue bite, and no visible peripheral injuries.  His roommates described him having convulsions and being postictally confused. Juan Harrell cannot recall having had an infection, a fall or traumatic head injury just before his seizure would have involved.  His memory for the morning of the day has been preserved, he is only amnestic for about 30 minutes involving the seizures to transport to the [facility] emergency department, but there he became completely aware of his surroundings and remembers the conversation and exam.  Patient presented actually with a normal pulse rate normal blood pressure respiratory rate at rest was 20 temperature was 98.3 Fahrenheit or 36.8 Celsius, and he was described as fully oriented to person place and time.  Head CT had been obtained without contrast, and this also returned as normal.  It was interpreted by [name], MD 07/17/2018. There was no tox screen done, and given his history of drinking 4-5 beers before the seizure I doubt he has epilepsy. He has been living in a halfway house.    Social : works 12- 7 PM , second shift.  He went to college without obtaining a degree.  He drinks beer,  multiple in a day- not daily- caffeeine- 1 cup a day, no sodas, no iced tea, hot tea.  Smoker 1/2 ppd- for 10 years.   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 He has worked there again for the lat 3 month, had been there before rehab.  He is not longer on adderall for the last 3 years ( he had taken 200 mg) .   Interval history : 01-23-2019:  Patient here with parents on video connection, and Juan Harrell, his girl friend.  Juan Harrell is a 38 year old Caucasian right-handed individual whom I have encountered once before for seizure related activity.  He was asked to return for an EEG but no -showed, and he had no follow up .   Since our last visit he has had several more seizures the first followed on Thanksgiving day 2019.  He suffered 2 seizures separated by a period of 4 to 6 hours. Individual seizures lasted 3 minutes.  He was seen in the emergency room.  The I do not have the documentation of that visit available here but the next visit from Christmas day 11-22-18 at 7:30 PM he presents with multiple seizures he had not taken his Keppra, had mild headaches at the time had tonic-clonic seizures -convulsions but with witnessed, no urinary incontinence or tongue biting.  He was previously in  his normal state of health he was not febrile did not struggle with any infection.  Dr. Patria Mane in the emergency room prescribed Keppra 500 mg twice daily vital signs were remarkably normal blood pressure 124/72 heart rate 83 respiratory rate 16.   Drug screen was not obtained.  Total bilirubin was elevated at 1.4, glucose 103.  The last seizure occurred on 03 December 2018 at 10 AM, they had already started the night before and the patient had witnessed for seizures had not been taking the Keppra, presented with a very altered mental status.  The seizures did not continue into the ED visit.  Again no fever, also the patient appeared confused he was able to move all 4  extremities and follows simple commands.  Total bilirubin again elevated at 1.3, capillary glucose normal levels.  He was now ordered 1500 mg Keppra orally and loaded food".  He was asked to fill his prescription.  He was discharged in the night of 03 December 2020 home care.  He started to take Keppra and had no further seizures. He is able to describe a sense of doom, panic, and unpleasant taste in his mouth. 2 minutes prior to seizing. He reports sweating more on Keppra .   [id number]:  Melvyn Novas, MD    VIDEO VIST: patient reportedly at his current residential home in [place], [place]. Patient is alone in the screen view.   The MD provider is at the [facility] office.   This is a video visit for final  follow up on seizure activity in a young caucasain male with a history of  polysubstance abuse.   Juan Harrell, Juan Hageman. has meanwhile moved to the [facility], he still has not established himself with a primary care physician as requested.  He has undergone an EEG recording on 10 December 2019, finally.  This had been ordered in February 2020.  The study was a 35-minute long EEG recording showing a 9 Hz posterior dominant rhythm, some excessive eye blink artifact but normal EEG overall no epileptiform activity.  The patient reported that he took his Keppra regularly, he understood that we expect him to avoid all seizure threshold lowering substances and while I do not think that the patient has primary epilepsy I do think that alcohol and polysubstance abuse in general have lowered his seizure threshold to where he had repeatedly seizure activity while intoxicated or after a night of drinking.  Juan Harrell sometimes experienced still panic attacks and abnormal smells, consistent with an olfactory aura -  For this reason I feel he must remain on Keppra even that he is not reporting seizures per se.   PS : As I reviewed his records right after we concluded this video visit  During which he  requested forwarding his records from [place] to his new address, I noted that he had been admitted to the ED on 12-10-2019 with alcohol intoxication. No seizure was reported.   See below , [place] , [employer] - encounter in local ED- CHIEF COMPLAINT  Chief Complaint  Patient presents with  . Alcohol Intoxication  ETOH USE TONIGHT, WAS FOUND APPROACHING SOMEONES HOUSE AND THEN ARRESTED, SENT HERE FROM JAIL FOR SMALL LAC ON HAND  . Seizure-Prior Hx of:  PT'S FATHER STATES HE HAS HX OF SEIZURES WHEN HE DRINKS, PT STATES HE DOES NOT THINK HE HAS HAD A SEIZURE THIS EVENING.   Juan Harrell is a 34 y.o. male who presents to the emergency department for  presumed alcohol intoxication and possible seizure. He has apparently found approaching someone's house and was arrested by Patent examiner. Allegedly while in jail he reported that he had a seizure earlier sometime today so EMS was called to transport the patient to the emergency room. Patient is agitated on arrival requiring special police at bedside he became even more agitated started screaming yelling and stating "I am going to fight that asshole."   Review of Systems: Out of a complete 14 system review, the patient complains of only the following symptoms, and all other reviewed systems are negative. Some olfactory abnormal sensation, a feeling of rising anxiety.  withdrawal or aura?  No urinary incontinence, no unexplained bruises or hematomata, no tongue bite when waking up in the morning.  Social History   Socioeconomic History  . Marital status: Unknown    Spouse name: Not on file  . Number of children: Not on file  . Years of education: Not on file  . Highest education level: Not on file  Occupational History  . Not on file  Tobacco Use  . Smoking status: Current Every Day Smoker    Packs/day: 0.50  . Smokeless tobacco: Never Used  Substance and Sexual Activity  . Alcohol use: Yes    Comment: ocassional   . Drug use: Never  .  Sexual activity: Not on file  Other Topics Concern  . Not on file  Social History Narrative  . Not on file   Social Determinants of Health   Financial Resource Strain:   . Difficulty of Paying Living Expenses: Not on file  Food Insecurity:   . Worried About Programme researcher, broadcasting/film/video in the Last Year: Not on file  . Ran Out of Food in the Last Year: Not on file  Transportation Needs:   . Lack of Transportation (Medical): Not on file  . Lack of Transportation (Non-Medical): Not on file  Physical Activity:   . Days of Exercise per Week: Not on file  . Minutes of Exercise per Session: Not on file  Stress:   . Feeling of Stress : Not on file  Social Connections:   . Frequency of Communication with Friends and Family: Not on file  . Frequency of Social Gatherings with Friends and Family: Not on file  . Attends Religious Services: Not on file  . Active Member of Clubs or Organizations: Not on file  . Attends Banker Meetings: Not on file  . Marital Status: Not on file  Intimate Partner Violence:   . Fear of Current or Ex-Partner: Not on file  . Emotionally Abused: Not on file  . Physically Abused: Not on file  . Sexually Abused: Not on file    No family history on file.  Past Medical History:  Diagnosis Date  . ADD (attention deficit disorder)   . Seizures (HCC)     Past Surgical History:  Procedure Laterality Date  . TONSILLECTOMY      Current Outpatient Medications  Medication Sig Dispense Refill  . levETIRAcetam (KEPPRA) 1000 MG tablet Take 1 tablet (1,000 mg total) by mouth 2 (two) times daily. 180 tablet 3   No current facility-administered medications for this visit.    Allergies as of 01/22/2020  . (No Known Allergies)    Vitals:  This is a video visit and no physical exam was possible : 01-22-2020 There were no vitals taken for this visit. Last Weight:  Wt Readings from Last 1 Encounters:  01/23/19 158 lb (71.7  kg)   Last Height:   Ht Readings  from Last 1 Encounters:  01/23/19 5' 10.5" (1.791 m)    Physical exam:  General: The patient is awake, alert and appears not in acute distress.  The patient is cooperative. He appears better groomed and nourished than in his last encounter.  Head: Normocephalic, atraumatic.  No distended neck veins. Respiratory: not short of breath, but he smoking while on screen with me (!).  Skin:  Without evidence of edema, or rash Trunk:  normal posture while seated at the desk during video conversatin. Marland Kitchen  Neurologic exam : The patient is awake and alert, oriented to place and time.  Memory subjective described as intact.  There is a normal attention span & concentration ability.  Speech is fluent without dysarthria, dysphonia or aphasia.  Mood and affect are appropriate.  Cranial nerves:, Pupils are equal  and reactive to light.  Funduscopic exam deferred.  Extraocular movements  in vertical and horizontal planes intact . Facial motor strength is symmetric .Shoulder shrug is normal.    Assessment:  After physical and neurologic examination, review of laboratory studies, imaging, neurophysiology testing and pre-existing records, assessment is that of :  Juan Harrell, Juan Hageman. is a 34 year old Caucasian right-handed male who presents today after having suffered a seizure in late August 2019.  He had a previous seizure that could have been provoked by an overdose of Adderall, amongst other substances. Juan Harrell, Juan Hageman. is a 34 year old Caucasian right-handed male who presents today after having suffered a first seizure in late August 2019. He has had additional seizures in November 2019 ( possibly with alcohol) , Christmas 2019  ( 2 beers)  and 12-03-2018 ( he stated that no alcohol was involved). He admitted in our first face to face encounter that he was still drinking.   Had his last recorded seizures were on 11-22-2018 and again  12-03-2018 ED at [facility].     The seizure occurred at  about 7 PM, and he had several beers during the day before the seizure occurred.  There were no other substances tested for- no tox documented.  He has not been on prescription Adderall in many years now.  Given his history of a second seizure with the likelihood of a seizure threshold lowering substance being present at the time I would not consider his diagnosis epilepsy.  He did not have epileptic events during his childhood or infancy, he has no family history of epilepsy.    He was at the time of our last face to face visit ( our first and only )   not able to drive or operate machinery for another 10 months for other reasons then the recent seizure.  He now has passed this 10 month period.    PS : He was ( as I noted after the conclusion of this video visit ) just seen in ED again for alcohol intoxication(!) on 12-10-2019 in [place], [place] . He was brought to the ED by Police and spent a night in jail.  A comprehensive toxicology screen was again not obtained and Ethanol concentration was found to be less than 10, this screen would miss LSD, Amphetamines, Cannabis, Mushrooms, etc. Apparently his ED MD filled Keppra at only 500 mg bid, down from 1000 mg bid.   Plan:  Treatment plan and additional workup : all seizure threshold lowering circumstance, substances to be avoided. STOP DRINKING ALCOHOL.  Your risk of suffering another seizure is many times higher  than when not drinking.  Any new seizure will keep your driving privileges suspended for at least 6 month.    Engage in daily outdoor physical activity.  You may resume driving now after 10 month of being on Keppra and free of seizures, as you reported. That decision is based solely on seizure medical history you provided.   You may have other driving restrictions due to legal infringement or past traffic violations /accidents that I am not aware off at this time.  We continue to prescribe Keppra for the next 6 month until you established  with a local PCP in [place], [place].  It will be necessary to have blood work once a year either through [facility] or through a local lab and share the results with [place] if you decide to follow up here. You can not miss any more follow up appointments that you have initiated. I leave you as an active patient with [facility] until 01-21-2021.     Needed yearly are - CMET ( comprehensive metabolic panel), CBC diff( complete blood cell count  with differential ) and Keppra level.   We will send your medical records to your new address as requested.   Total encounter time dedicated to EEG review, Keppra refill and transfer of cafe to primary local provider was 10 minutes.  Asencion Partridge Inanna Telford MD 01/22/2020

## 2020-04-12 ENCOUNTER — Other Ambulatory Visit: Payer: Self-pay | Admitting: Neurology

## 2020-05-08 ENCOUNTER — Telehealth: Payer: Self-pay | Admitting: Neurology

## 2020-05-08 MED ORDER — LEVETIRACETAM 1000 MG PO TABS: 1000.0000 mg | ORAL_TABLET | Freq: Two times a day (BID) | ORAL | 0 refills | Status: DC

## 2020-05-08 NOTE — Telephone Encounter (Signed)
I contacted the pt and advised via vm refill has been sent.

## 2020-05-08 NOTE — Telephone Encounter (Signed)
Pt called and LVM stating that he is needing a refill on his levETIRAcetam (KEPPRA) 1000 MG tablet and he needs it sent to the [facility] in [place]. Pt would like a call back.

## 2020-09-12 IMAGING — CT CT HEAD W/O CM
3 series · 15 of 47 positions shown, 18 images · non-contrast
Comparison: CT of the head performed 07/17/2018

CLINICAL DATA: Acute onset of seizures.

EXAM:
CT HEAD WITHOUT CONTRAST
TECHNIQUE: Contiguous axial images were obtained from the base of the skull
through the vertex without intravenous contrast.

[Series 2: head wo · axial · 0.47mm/px · z∈[-139,-9]mm · 9 of 32 slices shown, 12 images]
[im 3/32  brain]
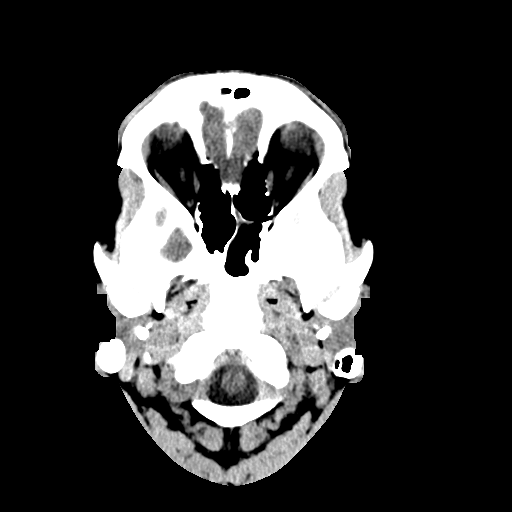
[im 3/32  bone]
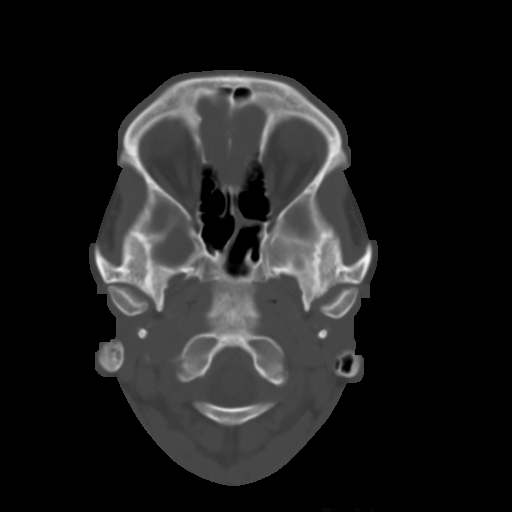
[im 6/32  brain]
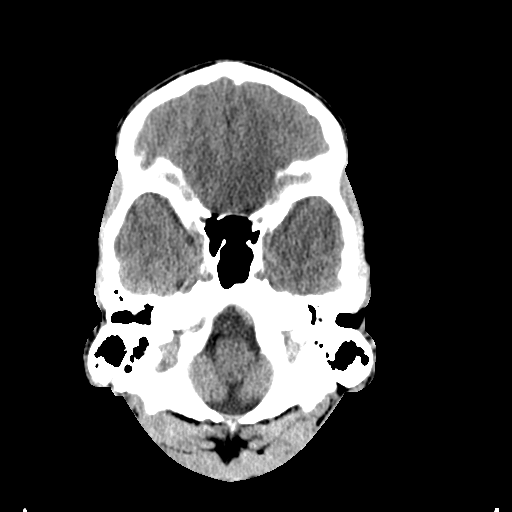
[im 9/32  brain]
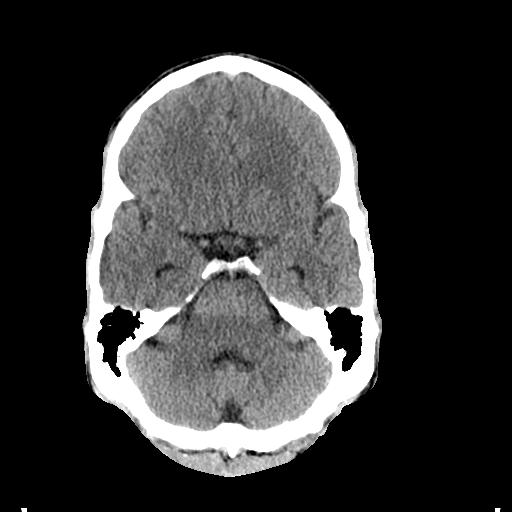
[im 12/32  brain]
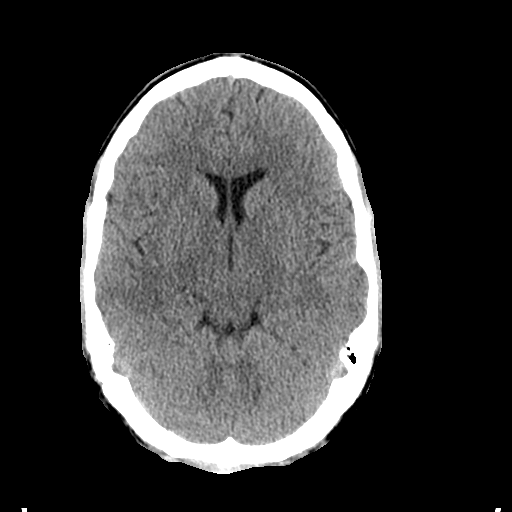
[im 17/32  brain]
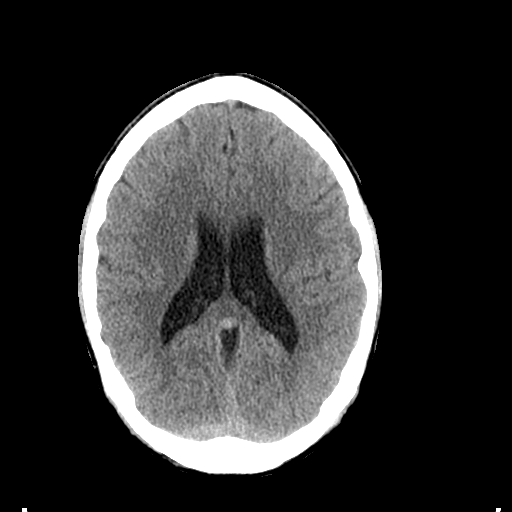
[im 17/32  bone]
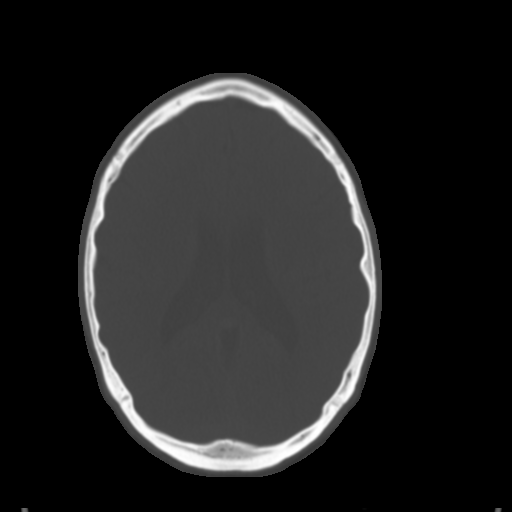
[im 20/32  brain]
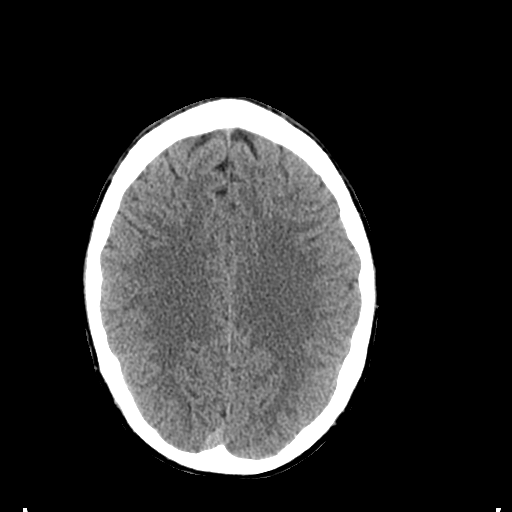
[im 23/32  brain]
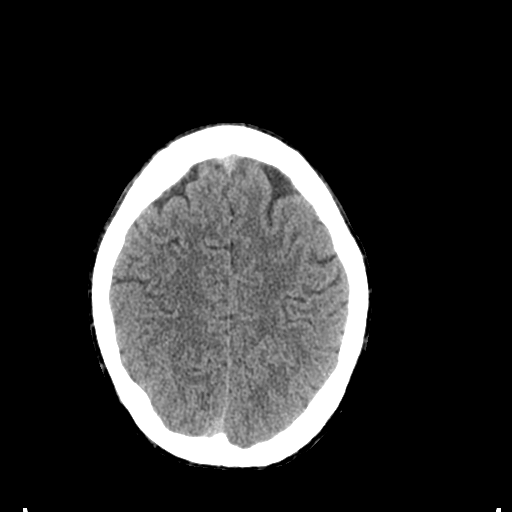
[im 26/32  brain]
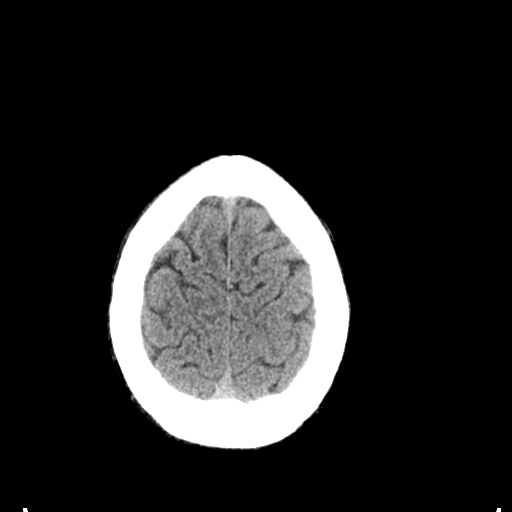
[im 29/32  brain]
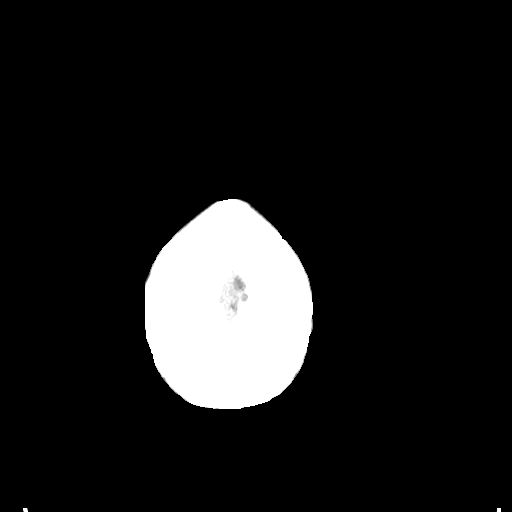
[im 29/32  bone]
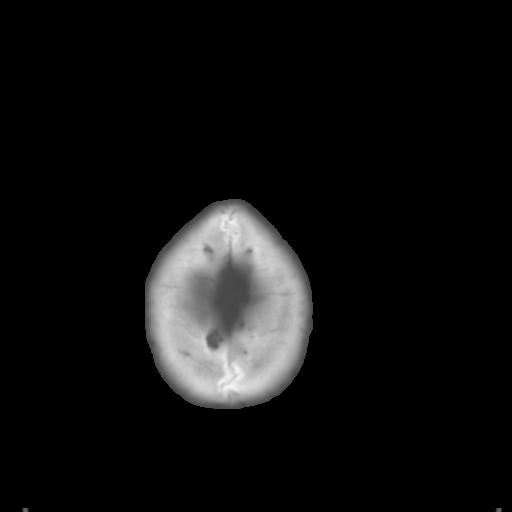

[Series 5: coronal soft tissue · coronal · 0.34mm/px · 3 of 77 slices shown]
[im 26/77  brain]
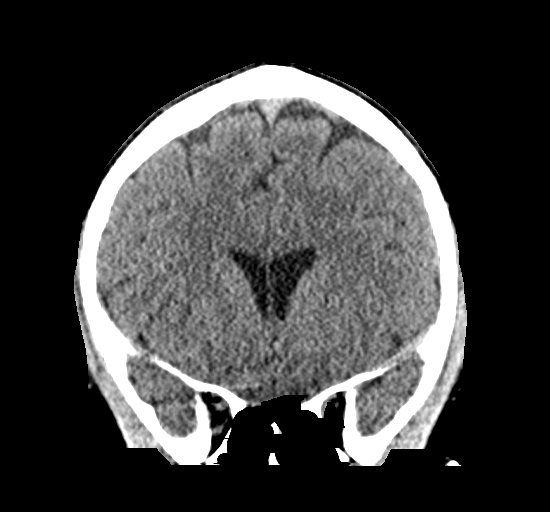
[im 34/77  brain]
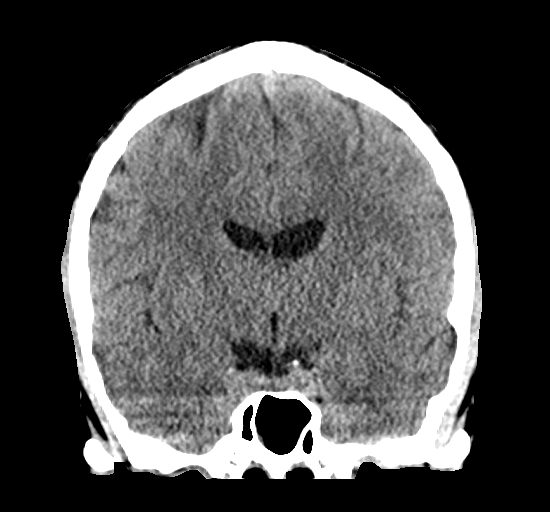
[im 43/77  brain]
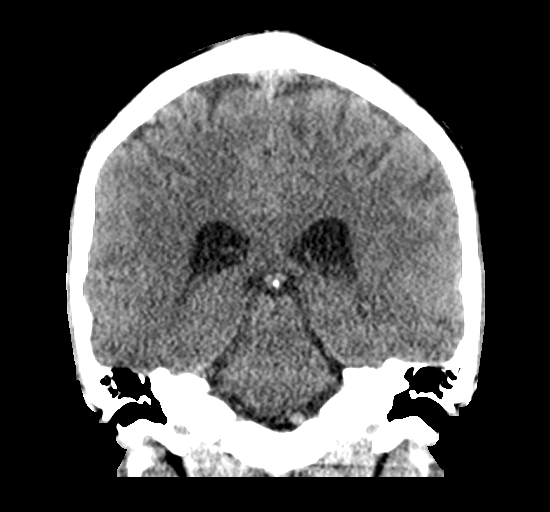

[Series 6: sagittal soft tissue · sagittal · 0.30mm/px · 3 of 56 slices shown]
[im 19/56  brain]
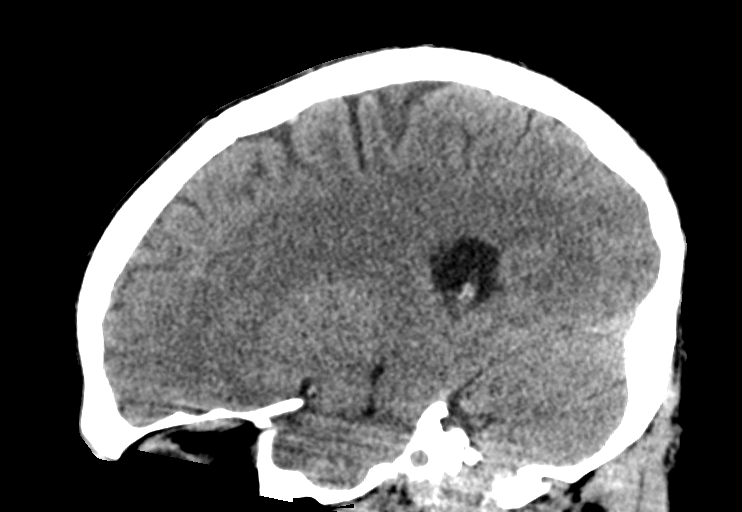
[im 28/56  brain]
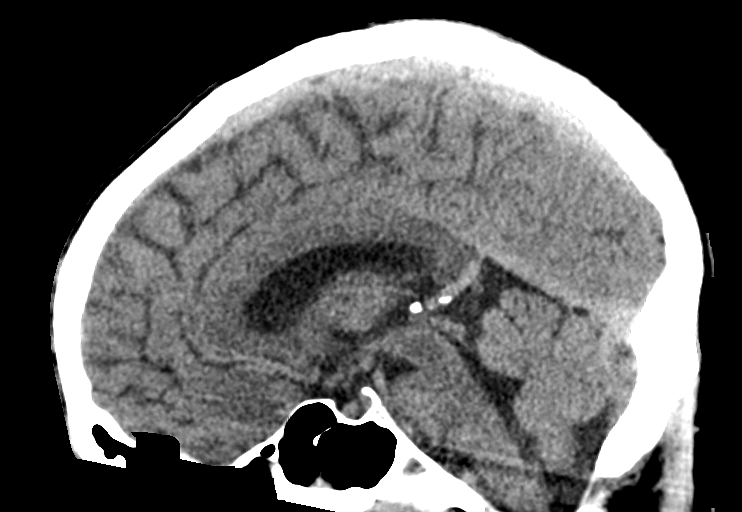
[im 37/56  brain]
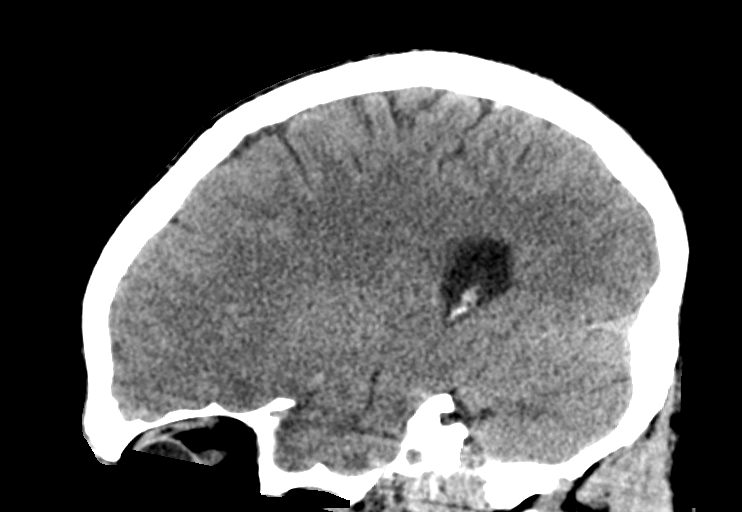

[15 of 47 positions shown; findings below may reference images not displayed]

FINDINGS: Brain: No evidence of acute infarction, hemorrhage, hydrocephalus,
extra-axial collection or mass lesion/mass effect.

The posterior fossa, including the cerebellum, brainstem and fourth
ventricle, is within normal limits. The third and lateral
ventricles, and basal ganglia are unremarkable in appearance. The
cerebral hemispheres are symmetric in appearance, with normal
gray-white differentiation. No mass effect or midline shift is seen.

Vascular: No hyperdense vessel or unexpected calcification.

Skull: There is no evidence of fracture; visualized osseous
structures are unremarkable in appearance.

Sinuses/Orbits: The visualized portions of the orbits are within
normal limits. The paranasal sinuses and mastoid air cells are
well-aerated.

Other: No significant soft tissue abnormalities are seen.
IMPRESSION: Unremarkable noncontrast CT of the head.

## 2020-09-16 ENCOUNTER — Telehealth: Payer: Self-pay | Admitting: Neurology

## 2020-09-16 MED ORDER — LEVETIRACETAM 1000 MG PO TABS: 1000.0000 mg | ORAL_TABLET | Freq: Two times a day (BID) | ORAL | 0 refills | Status: DC

## 2020-09-16 NOTE — Telephone Encounter (Signed)
I received call from [facility] answering service stating that patient is requesting medication be called in to [facility] in [place].  I reviewed the patient's chart the only medication we are giving him were Keppra and I called in a refill of Keppra 1 g twice daily to his requested pharmacy.  I called the patient listed number and left a message on his answering machine.

## 2020-09-29 ENCOUNTER — Other Ambulatory Visit: Payer: Self-pay | Admitting: Neurology

## 2020-09-29 ENCOUNTER — Telehealth: Payer: Self-pay | Admitting: Neurology

## 2020-09-29 DIAGNOSIS — F1920 Other psychoactive substance dependence, uncomplicated: Secondary | ICD-10-CM

## 2020-09-29 DIAGNOSIS — F19921 Other psychoactive substance use, unspecified with intoxication with delirium: Secondary | ICD-10-CM

## 2020-09-29 DIAGNOSIS — R569 Unspecified convulsions: Secondary | ICD-10-CM

## 2020-09-29 DIAGNOSIS — F1010 Alcohol abuse, uncomplicated: Secondary | ICD-10-CM

## 2020-09-29 DIAGNOSIS — G40909 Epilepsy, unspecified, not intractable, without status epilepticus: Secondary | ICD-10-CM

## 2020-09-29 NOTE — Telephone Encounter (Signed)
Referral placed for the patient 

## 2020-09-29 NOTE — Telephone Encounter (Signed)
Family Team Counselor [name] is requesting an outgoing referral to neurologist in [place] with Dr. Adalberto Cole MD  Fax [phone]

## 2020-12-11 ENCOUNTER — Other Ambulatory Visit: Payer: Self-pay | Admitting: Neurology

## 2020-12-11 ENCOUNTER — Telehealth: Payer: Self-pay | Admitting: Neurology

## 2020-12-11 ENCOUNTER — Encounter: Payer: Self-pay | Admitting: *Deleted

## 2020-12-11 NOTE — Telephone Encounter (Addendum)
I called the patient back. He is going to discuss refills with a primary care physician while trying to establish care with a new neurologist in his area.

## 2020-12-11 NOTE — Telephone Encounter (Signed)
Pt. states he contacted the [facility] Neurology office & they informed him that they are not accepting new patients at this time. He is requesting a refill for levETIRAcetam (KEPPRA) 1000 MG tablet as he does not have a new neurologist.  Pharmacy: [facility] [phone]

## 2020-12-12 ENCOUNTER — Telehealth: Payer: Self-pay | Admitting: Neurology

## 2020-12-12 ENCOUNTER — Other Ambulatory Visit: Payer: Self-pay | Admitting: Neurology

## 2020-12-12 NOTE — Telephone Encounter (Signed)
Patient requested Keppra refill again via phone message to ph staff today. Please review. Refill send for one month to requested pharmacy.

## 2020-12-12 NOTE — Telephone Encounter (Signed)
Pt is calling about the  refilling of his levETIRAcetam (KEPPRA) 1000 MG tablet to [id number] he was told it was going to be done on yesterday.  Pt is asking if there is anyway this can be completed for him today.

## 2020-12-12 NOTE — Telephone Encounter (Signed)
See ph noted from today.

## 2020-12-16 NOTE — Telephone Encounter (Signed)
Thank you Saima,  The patient asked for the named pharmacy in [place] and we sent him 30 days supplies of seizure medication. This was 12-11-2020, his pharmacy must have filled it by now.  He can not be on remote control, treated from afar. CD

## 2020-12-23 ENCOUNTER — Encounter: Payer: Self-pay | Admitting: Neurology

## 2020-12-23 ENCOUNTER — Other Ambulatory Visit: Payer: Self-pay

## 2020-12-23 ENCOUNTER — Ambulatory Visit: Payer: Self-pay | Admitting: Neurology

## 2020-12-23 VITALS — BP 118/75 | HR 60 | Ht 70.0 in | Wt 147.0 lb

## 2020-12-23 DIAGNOSIS — F1010 Alcohol abuse, uncomplicated: Secondary | ICD-10-CM

## 2020-12-23 DIAGNOSIS — R569 Unspecified convulsions: Secondary | ICD-10-CM

## 2020-12-23 DIAGNOSIS — F1920 Other psychoactive substance dependence, uncomplicated: Secondary | ICD-10-CM

## 2020-12-23 MED ORDER — LEVETIRACETAM 1000 MG PO TABS: 1000.0000 mg | ORAL_TABLET | Freq: Two times a day (BID) | ORAL | 5 refills | Status: DC

## 2020-12-23 NOTE — Progress Notes (Signed)
Provider:  Melvyn Harrell  Juan Harrell, M D  Referring Provider: Azalia BilisKevin Campos, MD  Primary Care Physician:  ED  Chief Complaint  Patient presents with  . Follow-up    pt alone, rm 11 pt [facility]  [facility] ED on 07-17-2018 with a seizure. The pt states that prior to this he had one sz ( and was placed in coma over that) Christmas Eve 2015 with an overdose of adderall, alcohol and LSD.    Pt alone, rm 10. Pt presents today for yearly medication management apt. He was last seen through video visit 12/2019. [place] but didn't established with care there. Currently resides in [place] and is employeed there. Pt was referred to a neurologist in [place] but was not accepting patients at that time so is still managing sz through our office. Here for for medication management.      Other Overall pt has been stable until recently, couple months ago,he had just gotten off from working a double and he went to [employer]. He must of had a seizure in [place] [employer] parking lot and was found postictal, combative. Cops were called thinking he was intoxicated, police responded and arrested him. The next thing he knew he was waking up in a jail. Had no recollection of the event. This is only sz event that has happened. States intermittently will have metal taste but that has gotten better.        He had an interval visit on Video 2021, and now here in person. He has not been seen for a while , had moved to [place] and assured me he couldn't find a neurologist or PCP. He remains uninsured , has neither tried to go on medicaid not subsidized care and is now again unemployed.  He is here , looking rather depressed and was apparently taken to jail in [place] overnight, after becoming "combative and disorderly"- his explanation was that he must have had a seizure- he had worked a double shift of 16 hours at [employer] , was sleep deprived, he had a place to stay but no ride home from work, so he hung out at  [employer]. He seems to have been picked up by police outside, has no memory of it. He was never tested for intoxication an he stated his state was just assumed by police. He has not been established with a PCP nor neurologist , needs refills, and he truly has no documentation of taking meds either - he denies hallucinations, but felt under stress, but he denies a psychotic break-down. He attributed his memory loss, his amnesia for the events, to a seizure which was nether witnessed , not reported and he had no urinary incontinence or tongue bite.    Interval history : 01-23-2019:  Patient here with parents on video connection, and Juan DikeJennifer, his girl friend.  Juan Harrell is a 89 year old Caucasian right-handed individual whom I have encountered once before for " seizure related activity" .  He was asked to return for an EEG but no -showed, and he had no follow up, has been lost .   Since our last visit he has had several more seizures the first followed on Thanksgiving day 2019.  He suffered 2 seizures separated by a period of 4 to 6 hours. Individual seizures lasted 3 minutes.  He was seen in the emergency room.  The I do not have the documentation of that visit available here but the next visit from Christmas  day 11-22-18 at 7:30 PM he presents with multiple seizures after he had not taken his Keppra, had mild headaches at the time had tonic-clonic seizures -convulsions but with witnessed, no urinary incontinence or tongue biting.  He was previously in his normal state of health he was not febrile did not struggle with any infection.  Juan Harrell in the emergency room prescribed Keppra 500 mg twice daily vital signs were remarkably normal blood pressure 124/72 mmhg, heart rate 83/min. respiratory rate 16/min.   Drug screen was not obtained.  Total bilirubin was elevated at 1.4, glucose 103.  The last seizure occurred on 03 December 2018 at 10 AM, they had already started the night before and the patient had  witnessed for seizures had not been taking the Keppra, presented with a very altered mental status.  The seizures did not continue into the ED visit.  Again no fever, also the patient appeared confused he was able to move all 4 extremities and follows simple commands.  Total bilirubin again elevated at 1.3, capillary glucose normal levels.  He was now ordered 1500 mg Keppra orally and loaded food".  He was asked to fill his prescription.  He was discharged in the night of 03 December 2020 home care.  He started to take Keppra and had no further seizures. He is able to describe a sense of doom, panic, and unpleasant taste in his mouth. 2 minutes prior to seizing. He reports sweating more on Keppra .    HPI:  Juan Harrell. is a 35 y.o. male and was seen here in the presence of his mother on 08-01-2018 as a referral from Dr. ED  for seizures. Juan Harrell resides at [facility], his roommates found him seizing on 17 July 2018 and he arrived at the emergency room at 1930 hours.  At the time he had neither the bowel or bladder dysfunction and incontinence no tongue bite, and no visible peripheral injuries.  His roommates described him having convulsions and being postictally confused. Juan Harrell cannot recall having had an infection, a fall or traumatic head injury just before his seizure would have involved.  His memory for the morning of the day has been preserved, he is only amnestic for about 30 minutes involving the seizures to transport to the [facility] emergency department, but there he became completely aware of his surroundings and remembers the conversation and exam.  Patient presented actually with a normal pulse rate normal blood pressure respiratory rate at rest was 20 temperature was 98.3 Fahrenheit or 36.8 Celsius, and he was described as fully oriented to person place and time.  Head CT had been obtained without contrast, and this also returned as normal.  It was interpreted by [name], MD 07/17/2018. There was no tox screen done, and given his history of drinking 4-5 beers before the seizure I doubt he has epilepsy. He has been living in a halfway house.    Social : works 12- 7 PM , second shift.  He went to college without obtaining a degree.  He drinks beer, multiple in a day- not daily- caffeeine- 1 cup a day, no sodas, no iced tea, hot tea.  Smoker 1/2 ppd- for 10 years.  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 He has worked there again for the lat 3 month, had been there before rehab. He is not longer on adderall for the last 3 years ( he  had taken 200 mg) .    Review of Systems: Out of a complete 14 system review, the patient complains of only the following symptoms, and all other reviewed systems are negative.  No urinary incontinence, no unexplained bruises or hematomata, no tongue bite when waking up in the morning. still unchanged but multiple seizures were reported.     12-23-2020;  Presumed new seizure break through, has not yet run out of Keppra , we filled a 30 day supply for him to search for a new neurologist and PCP.   Social History   Socioeconomic History  . Marital status: Unknown    Spouse name: Not on file  . Number of children: Not on file  . Years of education: Not on file  . Highest education level: Not on file  Occupational History  . Not on file  Tobacco Use  . Smoking status: Current Every Day Smoker    Packs/day: 0.50  . Smokeless tobacco: Never Used  Vaping Use  . Vaping Use: Never used  Substance and Sexual Activity  . Alcohol use: Yes    Comment: ocassional   . Drug use: Never  . Sexual activity: Not on file  Other Topics Concern  . Not on file  Social History Narrative  . Not on file   Social Determinants of Health   Financial Resource Strain: Not on file  Food Insecurity: Not on file  Transportation Needs: Not on file  Physical  Activity: Not on file  Stress: Not on file  Social Connections: Not on file  Intimate Partner Violence: Not on file    No family history on file.  Past Medical History:  Diagnosis Date  . ADD (attention deficit disorder)   . Intoxication by drug, with delirium (HCC) 01/22/2020  . Seizures (HCC)     Past Surgical History:  Procedure Laterality Date  . TONSILLECTOMY      Current Outpatient Medications  Medication Sig Dispense Refill  . levETIRAcetam (KEPPRA) 1000 MG tablet Take 1 tablet (1,000 mg total) by mouth 2 (two) times daily. 60 tablet 0   No current facility-administered medications for this visit.    Allergies as of 12/23/2020  . (No Known Allergies)    Vitals: BP 118/75   Pulse 60   Ht 5\' 10"  (1.778 m)   Wt 147 lb (66.7 kg)   BMI 21.09 kg/m  Last Weight:  Wt Readings from Last 1 Encounters:  12/23/20 147 lb (66.7 kg)   Last Height:   Ht Readings from Last 1 Encounters:  12/23/20 5\' 10"  (1.778 m)    Physical exam:  General: The patient is awake, alert and appears not in acute distress. The patient is poorly groomed. Head: Normocephalic, atraumatic. Neck is supple. Mallampati, neck circumference:14.5  Cardiovascular:  Regular rate and rhythm, without murmurs or carotid bruit, and without distended neck veins. Respiratory: Lungs are clear to auscultation. Skin:  Without evidence of edema, or rash. His face appears flushed.  Trunk: BMI is 23, and patient  has a stooped posture.  Neurologic exam : The patient is awake and alert, oriented to place and time.  Memory subjective described as intact.  There is a normal attention span & concentration ability.  Speech is fluent without dysarthria, dysphonia or aphasia. Mood and affect are calm and cooperative. .  Cranial nerves: Pupils are equal and briskly reactive to light.  Funduscopic exam without evidence of pallor or edema. Extraocular movements  in vertical and horizontal planes intact and with  end  point nystagmus.  Visual fields by finger perimetry are intact. Hearing to finger rub intact.   Facial sensation intact to fine touch. Facial motor strength is symmetric and tongue and uvula move midline. Tongue protrusion into either cheek is normal. Shoulder shrug is normal.  Eyelid flutter noted.   Motor exam:   Normal tone ,muscle bulk and symmetric strength in all extremities. Equal grip strength.  Sensory:  Fine touch, pinprick and vibration were tested in all extremities. Proprioception was normal. Coordination: there os tremor at rest and with action. Eyelid flutter and finger tremor.  Rapid alternating movements in the fingers/hands were also impaired by tremor.  Mild , fine amplitude tremor at action.  Finger-to-nose maneuver with the same mild  tremor. Gait and station: Patient walks without assistive device  .Strength within normal limits. Stance is stable and normal.  Tandem gait is unfragmented. He is jittery- but not unsteady. Deep tendon reflexes: in the upper and lower extremities are symmetric and intact.    Assessment:  After physical and neurologic examination, review of laboratory studies, imaging, neurophysiology testing and pre-existing records, assessment is that of :   21 Minutes:  Mr. Juan Harrell, Juan Harrell. is a 35 year old Caucasian right-handed male who presents today after having suffered a first seizure in late August 2019. He has now had additional seizures in November 2019 ( possibly with alcohol) , Christmas 2019  ( 2 beers)  and 12-03-2018 ( no alcohol involved).  Now again for  possible seizure break through that already happened in 11-03-2020- in [facility] - and he still drinks beer. He assured me he had taken meds( Keppra ).   He is still drinking. He lives in an RV and he has intermittent employment.  He has been COVID vaccinated after a COVID infection in October 2021, not prior.  Continue Keppra  1000 mg bid ( the current dose, refilled after emergency call on  12-12-2020) the first time I have heart form him in 18 month.   Get sleep, eat a moderate diet and eat regularly.     EEG  was done in 2020, MRI will not be possible( self pay). I wish there would have been a medical evaluation in [place], by ED, tox screen,etc.    I like a social service evaluation for help with medicaid application.   AVOID all seizure threshold lowering circumstance, substances to be avoided. STOP DRINKING.     Porfirio Mylar Arissa Fagin MD 12/23/2020   PS : refilled Generic Keppra 1000 mg and I will send for social work consultation.  Patient  Denies suicidal ideation.     Porfirio Mylar Ashtian Villacis MD 12/23/2020

## 2020-12-23 NOTE — Patient Instructions (Signed)
Levetiracetam tablets What is this medicine? LEVETIRACETAM (lee ve tye RA se tam) is an antiepileptic drug. It is used with other medicines to treat certain types of seizures. This medicine may be used for other purposes; ask your health care provider or pharmacist if you have questions. COMMON BRAND NAME(S): Keppra, Roweepra What should I tell my health care provider before I take this medicine? They need to know if you have any of these conditions:  kidney disease  suicidal thoughts, plans, or attempt; a previous suicide attempt by you or a family member  an unusual or allergic reaction to levetiracetam, other medicines, foods, dyes, or preservatives  pregnant or trying to get pregnant  breast-feeding How should I use this medicine? Take this medicine by mouth with a glass of water. Follow the directions on the prescription label. Swallow the tablets whole. Do not crush or chew this medicine. You may take this medicine with or without food. Take your doses at regular intervals. Do not take your medicine more often than directed. Do not stop taking this medicine or any of your seizure medicines unless instructed by your doctor or health care professional. Stopping your medicine suddenly can increase your seizures or their severity. A special MedGuide will be given to you by the pharmacist with each prescription and refill. Be sure to read this information carefully each time. Contact your pediatrician or health care professional regarding the use of this medication in children. While this drug may be prescribed for children as young as 4 years of age for selected conditions, precautions do apply. Overdosage: If you think you have taken too much of this medicine contact a poison control center or emergency room at once. NOTE: This medicine is only for you. Do not share this medicine with others. What if I miss a dose? If you miss a dose, take it as soon as you can. If it is almost time for  your next dose, take only that dose. Do not take double or extra doses. What may interact with this medicine? This medicine may interact with the following medications:  carbamazepine  colesevelam  probenecid  sevelamer This list may not describe all possible interactions. Give your health care provider a list of all the medicines, herbs, non-prescription drugs, or dietary supplements you use. Also tell them if you smoke, drink alcohol, or use illegal drugs. Some items may interact with your medicine. What should I watch for while using this medicine? Visit your doctor or health care provider for a regular check on your progress. Wear a medical identification bracelet or chain to say you have epilepsy, and carry a card that lists all your medications. This medicine may cause serious skin reactions. They can happen weeks to months after starting the medicine. Contact your health care provider right away if you notice fevers or flu-like symptoms with a rash. The rash may be red or purple and then turn into blisters or peeling of the skin. Or, you might notice a red rash with swelling of the face, lips or lymph nodes in your neck or under your arms. It is important to take this medicine exactly as instructed by your health care provider. When first starting treatment, your dose may need to be adjusted. It may take weeks or months before your dose is stable. You should contact your doctor or health care provider if your seizures get worse or if you have any new types of seizures. You may get drowsy or dizzy. Do not drive,   use machinery, or do anything that needs mental alertness until you know how this medicine affects you. Do not stand or sit up quickly, especially if you are an older patient. This reduces the risk of dizzy or fainting spells. Alcohol may interfere with the effect of this medicine. Avoid alcoholic drinks. The use of this medicine may increase the chance of suicidal thoughts or actions.  Pay special attention to how you are responding while on this medicine. Any worsening of mood, or thoughts of suicide or dying should be reported to your health care provider right away. Women who become pregnant while using this medicine may enroll in the [place] American Antiepileptic Drug Pregnancy Registry by calling 1-888-233-2334. This registry collects information about the safety of antiepileptic drug use during pregnancy. What side effects may I notice from receiving this medicine? Side effects that you should report to your doctor or health care professional as soon as possible:  allergic reactions like skin rash, itching or hives, swelling of the face, lips, or tongue  breathing problems  dark urine  general ill feeling or flu-like symptoms  problems with balance, talking, walking  rash, fever, and swollen lymph nodes  redness, blistering, peeling or loosening of the skin, including inside the mouth  unusually weak or tired  worsening of mood, thoughts or actions of suicide or dying  yellowing of the eyes or skin Side effects that usually do not require medical attention (report to your doctor or health care professional if they continue or are bothersome):  diarrhea  dizzy, drowsy  headache  loss of appetite This list may not describe all possible side effects. Call your doctor for medical advice about side effects. You may report side effects to FDA at 1-800-FDA-1088. Where should I keep my medicine? Keep out of reach of children. Store at room temperature between 15 and 30 degrees C (59 and 86 degrees F). Throw away any unused medicine after the expiration date. NOTE: This sheet is a summary. It may not cover all possible information. If you have questions about this medicine, talk to your doctor, pharmacist, or health care provider.  2021 Elsevier/Gold Standard (2019-02-16 15:23:36)  

## 2020-12-24 LAB — COMPREHENSIVE METABOLIC PANEL
ALT: 11 IU/L (ref 0–44)
AST: 17 IU/L (ref 0–40)
Albumin/Globulin Ratio: 2.1 (ref 1.2–2.2)
Albumin: 4.6 g/dL (ref 4.0–5.0)
Alkaline Phosphatase: 63 IU/L (ref 44–121)
BUN/Creatinine Ratio: 16 (ref 9–20)
BUN: 12 mg/dL (ref 6–20)
Bilirubin Total: 0.7 mg/dL (ref 0.0–1.2)
CO2: 26 mmol/L (ref 20–29)
Calcium: 9.6 mg/dL (ref 8.7–10.2)
Chloride: 99 mmol/L (ref 96–106)
Creatinine, Ser: 0.73 mg/dL — ABNORMAL LOW (ref 0.76–1.27)
GFR calc Af Amer: 141 mL/min/{1.73_m2} (ref >59)
GFR calc non Af Amer: 122 mL/min/{1.73_m2} (ref >59)
Globulin, Total: 2.2 g/dL (ref 1.5–4.5)
Glucose: 82 mg/dL (ref 65–99)
Potassium: 4.4 mmol/L (ref 3.5–5.2)
Sodium: 139 mmol/L (ref 134–144)
Total Protein: 6.8 g/dL (ref 6.0–8.5)

## 2020-12-24 NOTE — Progress Notes (Signed)
Normal kidney and liver function, continue Keppra at 1000 mg bid po.

## 2020-12-25 ENCOUNTER — Encounter: Payer: Self-pay | Admitting: Neurology

## 2021-02-11 ENCOUNTER — Telehealth: Payer: Self-pay | Admitting: Neurology

## 2021-02-11 NOTE — Telephone Encounter (Signed)
Patient lives in [place], [place] and was brought to ED after a seizure with a facial injury- he reports he takes 500 mg keppra generic bid, instead of 1000 mg bid. He has not yet had a polysubstance screening.   I asked for him to be set up with local care provider as he no longer resides here.   Resume keppra 1000 mg bid, start lamictal if keppra not tolerated.

## 2021-02-11 NOTE — Telephone Encounter (Signed)
Juan Addison Childress,NP @ [facility] is asking for a call from Dr Vickey Huger, when calling please just ask for Larwance Sachs, the call will ring directly to ER

## 2021-06-22 ENCOUNTER — Telehealth: Payer: Self-pay | Admitting: Neurology

## 2021-06-22 NOTE — Telephone Encounter (Signed)
Patient states that he moved to [facility] and would like to know if we are able to refer him to a neurologist closer to him. Please advise.

## 2021-06-22 NOTE — Telephone Encounter (Signed)
I am sorry, I don't know any neurologist there !

## 2021-06-23 NOTE — Telephone Encounter (Signed)
Pt has been asked multiple times previously to establish with a PCP in the area that he is located in. Once he is established with a PCP they will be able to then have the pt established with a neurologist locally. If pt is stable on current medication plan, they may also be willing to order the meds for him.   If pt calls in, please advise that Dr Vickey Huger recommends getting a PCP first.

## 2021-06-29 ENCOUNTER — Ambulatory Visit: Payer: Self-pay | Admitting: Adult Health

## 2021-07-20 ENCOUNTER — Telehealth: Payer: Self-pay | Admitting: Neurology

## 2021-07-20 ENCOUNTER — Other Ambulatory Visit: Payer: Self-pay | Admitting: Neurology

## 2021-07-20 DIAGNOSIS — F1920 Other psychoactive substance dependence, uncomplicated: Secondary | ICD-10-CM

## 2021-07-20 DIAGNOSIS — R569 Unspecified convulsions: Secondary | ICD-10-CM

## 2021-07-20 DIAGNOSIS — G40909 Epilepsy, unspecified, not intractable, without status epilepticus: Secondary | ICD-10-CM

## 2021-07-20 NOTE — Telephone Encounter (Signed)
Tried calling pt back, went to VM, VM full

## 2021-07-20 NOTE — Telephone Encounter (Signed)
Called pt back. He has not found PCP yet. He would like referral placed to this neurologist (different than previous referral placed). Advised we will place referral but he will also need to get established with new PCP asap. He verbalized understanding.

## 2021-07-20 NOTE — Telephone Encounter (Signed)
Pt called, need a referral from Dr. Vickey Huger for new neurologist. [facility] in [place], [place]. Would like a call from the nurse.

## 2021-08-05 NOTE — Telephone Encounter (Signed)
Referral sent to [facility] in [place], [place]. Phone: [phone]. Fax: [phone].

## 2021-10-26 ENCOUNTER — Telehealth: Payer: Self-pay | Admitting: Neurology

## 2021-10-26 MED ORDER — LEVETIRACETAM 1000 MG PO TABS: 1000.0000 mg | ORAL_TABLET | Freq: Two times a day (BID) | ORAL | 0 refills | Status: AC

## 2021-10-26 NOTE — Telephone Encounter (Signed)
Pt called stating that he will be moving to [place], [place] and would like his levETIRAcetam (KEPPRA) 1000 MG tablet called in to the [facility] [address]  [phone]

## 2021-10-26 NOTE — Telephone Encounter (Signed)
Called and spoke w/ pt. He ever saw [facility] in [place], [place]. Phone: [phone]. Moved away from [place] and recently moved back to [place]. Has no PCP yet. Aware we will refill for 30 days and no more. Future refills should come from PCP or [facility]. He verbalized understanding and appreciation. I e-scribed rx.

## 2021-11-06 ENCOUNTER — Telehealth: Payer: Self-pay | Admitting: Neurology

## 2021-11-06 DIAGNOSIS — G40909 Epilepsy, unspecified, not intractable, without status epilepticus: Secondary | ICD-10-CM

## 2021-11-06 DIAGNOSIS — R569 Unspecified convulsions: Secondary | ICD-10-CM

## 2021-11-06 NOTE — Telephone Encounter (Signed)
Pt called asking for a referral to [facility] In [place]. Pt will be moving to [place] the first of January.

## 2021-11-09 NOTE — Telephone Encounter (Signed)
Order placed for the patient.

## 2021-11-11 NOTE — Telephone Encounter (Signed)
Referral has been sent to [facility]. Phone: [phone].

## 2021-11-26 NOTE — Telephone Encounter (Signed)
Pt would like a call from the nurse to discuss getting a refill for levETIRAcetam (KEPPRA) 1000 MG tablet.

## 2021-11-26 NOTE — Telephone Encounter (Signed)
Called and LVM for pt letting him know he will need to contact PCP or [facility] about refill for medication. Per conversation a month ago, we were only supplying 30 days and any future refills would need to come from them.
